# Patient Record
Sex: Male | Born: 1955 | Race: Black or African American | Hispanic: No | Marital: Single | Smoking: Current every day smoker
Health system: Southern US, Community
[De-identification: ages and names within clinical notes are randomized; demographics above are authoritative.]

## PROBLEM LIST (undated history)

## (undated) DIAGNOSIS — Z9889 Other specified postprocedural states: Secondary | ICD-10-CM

## (undated) DIAGNOSIS — A150 Tuberculosis of lung: Secondary | ICD-10-CM

## (undated) DIAGNOSIS — Z8719 Personal history of other diseases of the digestive system: Secondary | ICD-10-CM

## (undated) HISTORY — PX: KIDNEY STONE SURGERY: SHX686

## (undated) HISTORY — PX: HERNIA REPAIR: SHX51

---

## 1998-03-27 ENCOUNTER — Emergency Department (HOSPITAL_COMMUNITY): Admission: EM | Admit: 1998-03-27 | Discharge: 1998-03-27 | Payer: Self-pay | Admitting: Emergency Medicine

## 2000-07-01 ENCOUNTER — Emergency Department (HOSPITAL_COMMUNITY): Admission: EM | Admit: 2000-07-01 | Discharge: 2000-07-01 | Payer: Self-pay | Admitting: Emergency Medicine

## 2000-07-06 ENCOUNTER — Encounter: Payer: Self-pay | Admitting: Emergency Medicine

## 2000-07-06 ENCOUNTER — Emergency Department (HOSPITAL_COMMUNITY): Admission: EM | Admit: 2000-07-06 | Discharge: 2000-07-06 | Payer: Self-pay | Admitting: Emergency Medicine

## 2001-01-06 ENCOUNTER — Encounter: Payer: Self-pay | Admitting: Emergency Medicine

## 2001-01-06 ENCOUNTER — Emergency Department (HOSPITAL_COMMUNITY): Admission: EM | Admit: 2001-01-06 | Discharge: 2001-01-06 | Payer: Self-pay | Admitting: Emergency Medicine

## 2001-11-04 ENCOUNTER — Encounter: Admission: RE | Admit: 2001-11-04 | Discharge: 2001-11-04 | Payer: Self-pay | Admitting: General Practice

## 2001-11-04 ENCOUNTER — Encounter: Payer: Self-pay | Admitting: General Practice

## 2001-12-27 ENCOUNTER — Encounter: Payer: Self-pay | Admitting: Urology

## 2001-12-27 ENCOUNTER — Ambulatory Visit (HOSPITAL_COMMUNITY): Admission: RE | Admit: 2001-12-27 | Discharge: 2001-12-27 | Payer: Self-pay | Admitting: Urology

## 2002-01-09 ENCOUNTER — Ambulatory Visit (HOSPITAL_BASED_OUTPATIENT_CLINIC_OR_DEPARTMENT_OTHER): Admission: RE | Admit: 2002-01-09 | Discharge: 2002-01-09 | Payer: Self-pay | Admitting: Urology

## 2002-01-15 ENCOUNTER — Emergency Department (HOSPITAL_COMMUNITY): Admission: EM | Admit: 2002-01-15 | Discharge: 2002-01-16 | Payer: Self-pay | Admitting: Emergency Medicine

## 2002-01-16 ENCOUNTER — Encounter: Payer: Self-pay | Admitting: Emergency Medicine

## 2003-08-08 ENCOUNTER — Emergency Department (HOSPITAL_COMMUNITY): Admission: EM | Admit: 2003-08-08 | Discharge: 2003-08-09 | Payer: Self-pay | Admitting: Emergency Medicine

## 2004-11-20 ENCOUNTER — Emergency Department (HOSPITAL_COMMUNITY): Admission: EM | Admit: 2004-11-20 | Discharge: 2004-11-20 | Payer: Self-pay | Admitting: Emergency Medicine

## 2006-08-29 ENCOUNTER — Emergency Department (HOSPITAL_COMMUNITY): Admission: EM | Admit: 2006-08-29 | Discharge: 2006-08-29 | Payer: Self-pay | Admitting: Emergency Medicine

## 2009-05-16 ENCOUNTER — Emergency Department (HOSPITAL_COMMUNITY): Admission: EM | Admit: 2009-05-16 | Discharge: 2009-05-16 | Payer: Self-pay | Admitting: Emergency Medicine

## 2011-04-10 NOTE — Op Note (Signed)
[facility]  Patient:    Victor Walker, Victor Walker Visit Number: [id number] MRN: [id number]          Service Type: NES Location: NESC Attending Physician:  Lindaann Slough Proc. Date: 01/09/02 Admit Date:  01/09/2002                             Operative Report  PREOPERATIVE DIAGNOSIS:  Right ureteral stone.  POSTOPERATIVE DIAGNOSIS:  Right ureteral stone.  PROCEDURE PERFORMED:  Cystoscopy and right retrograde pyelogram, ureteroscopy with stone extraction and insertion JJ catheter.  SURGEON:  Lindaann Slough, M.D.  ANESTHESIA:  General.  INDICATION:  The patient is a 55 year old male who has been complaining of right flank and right lower quadrant pain on and off for one year.  The CT scan showed a stone at the right distal ureter.  A repeat KUB showed the stone at the right UVJ.  The patient has continued to complain of pain.  He is schedule today for cystoscopy and stone manipulation.  DESCRIPTION OF PROCEDURE:  Under general anesthesia, the patient was prepped and draped, and placed in the dorsolithotomy position.  A #22 Wappler cystoscope was inserted into the bladder.  The urethra is normal.  There is no stone or tumor in the bladder.  The ureteral orifices are in normal position and shape with clear efflux.  A cone tip catheter was passed through the cystoscope into the ureteral orifice, and then contrast was then injected through the cone tip catheter.  A filling defect is visualized in the distal ureter.  The cone tip catheter was then removed.  A guidewire was then passed through the cystoscope, but could not be passed beyond the stone.  The cone tip catheter was then removed.  The glide wire was passed through the open end catheter into the ureter.  Then the open end catheter was advanced over the glide wire, and the glide wire was removed, and replaced with a guidewire. The cystoscope and open end catheter were then removed.  A #6 French  rigid ureteroscope was then passed in the bladder and into the ureter, but the ureteroscope could not be passed beyond the intramural ureter.  The ureteroscope was then removed.  A ureteral balloon catheter was then passed over the guidewire and the intramural ureter was dilated.  Then the ureteral balloon catheter was removed.  The uteroscope was then passed into the bladder and the ureter.  The stone was then visualized in the distal ureter and extracted with a stone basket.  The ureteroscope was then reinserted into the ureter and there was no evidence of ureteral injury.  The ureteroscope was then removed.  The guidewire was then backloaded into the cystoscope and a #6 [place], 26 JJ catheter was passed over the guidewire, the proximal end of the JJ catheter is in the collecting system.  The distal end is in the bladder.  The bladder was then emptied and the cystoscope removed.  The patient tolerated the procedure well and left the operating room in satisfactory condition to the post anesthesia care unit. Attending Physician:  Lindaann Slough DD:  01/09/02 TD:  01/09/02 Job: [id number]

## 2012-05-03 ENCOUNTER — Emergency Department (HOSPITAL_COMMUNITY)
Admission: EM | Admit: 2012-05-03 | Discharge: 2012-05-03 | Disposition: A | Discharge status: Home / Self Care | Payer: Self-pay | Attending: Emergency Medicine | Admitting: Emergency Medicine

## 2012-05-03 ENCOUNTER — Emergency Department (HOSPITAL_COMMUNITY): Payer: Self-pay

## 2012-05-03 ENCOUNTER — Encounter (HOSPITAL_COMMUNITY): Payer: Self-pay | Admitting: *Deleted

## 2012-05-03 DIAGNOSIS — R209 Unspecified disturbances of skin sensation: Secondary | ICD-10-CM | POA: Insufficient documentation

## 2012-05-03 DIAGNOSIS — M5430 Sciatica, unspecified side: Secondary | ICD-10-CM

## 2012-05-03 DIAGNOSIS — Z91013 Allergy to seafood: Secondary | ICD-10-CM | POA: Insufficient documentation

## 2012-05-03 DIAGNOSIS — Z87442 Personal history of urinary calculi: Secondary | ICD-10-CM | POA: Insufficient documentation

## 2012-05-03 DIAGNOSIS — M5137 Other intervertebral disc degeneration, lumbosacral region: Secondary | ICD-10-CM | POA: Insufficient documentation

## 2012-05-03 DIAGNOSIS — M549 Dorsalgia, unspecified: Secondary | ICD-10-CM

## 2012-05-03 DIAGNOSIS — Z880 Allergy status to penicillin: Secondary | ICD-10-CM | POA: Insufficient documentation

## 2012-05-03 DIAGNOSIS — F17200 Nicotine dependence, unspecified, uncomplicated: Secondary | ICD-10-CM | POA: Insufficient documentation

## 2012-05-03 DIAGNOSIS — M51379 Other intervertebral disc degeneration, lumbosacral region without mention of lumbar back pain or lower extremity pain: Secondary | ICD-10-CM | POA: Insufficient documentation

## 2012-05-03 MED ORDER — HYDROCODONE-ACETAMINOPHEN 5-500 MG PO TABS: 1.0000 | ORAL_TABLET | Freq: Four times a day (QID) | ORAL | Status: AC | PRN

## 2012-05-03 MED ORDER — HYDROCODONE-ACETAMINOPHEN 5-325 MG PO TABS
2.0000 | ORAL_TABLET | Freq: Once | ORAL | Status: AC
  Administered 2012-05-03: 2 via ORAL
  Filled 2012-05-03: qty 2

## 2012-05-03 MED ORDER — PREDNISONE 20 MG PO TABS: ORAL_TABLET | ORAL | Status: AC

## 2012-05-03 NOTE — ED Notes (Signed)
Pt getting dressed to be discharged home 

## 2012-05-03 NOTE — Discharge Instructions (Signed)
Your xrays were read as showing degenerative changes in the lumbar spine, L3-4, L4-5, and L5-S1.   Take prednisone as prescribed.  You may take vicodin as need for pain. No driving for the next 6 hours or when taking vicodin. Also, do not take tylenol or acetaminophen containing medication when taking vicodin. Avoid bending at waist or heavy lifting more than 20 lbs for the next week. Try heat/heating pad to sore area. Follow up with primary care doctor in coming week for recheck - discuss referral to back specialist and/or further workup if symptoms fail to improve/resolve.   Return to ER if worse, leg numbness/weakness, intractable pain, other concern.        RESOURCE GUIDE  Chronic Pain Problems: Contact [facility]  [phone] Patients need to be referred by their primary care doctor.  Insufficient Money for Medicine: Contact United Way:  call "211" or [facility] 780-470-6889.  No Primary Care Doctor: - Call [facility]  [phone] - can help you locate a primary care doctor that  accepts your insurance, provides certain services, etc. - Physician Referral Service- [phone]  Agencies that provide inexpensive medical care: - [name]  [phone] - [name]  [phone] - [facility]  [phone] - Women's Clinic  [phone] - Planned Parenthood  [phone] [facility]  [phone]  Medicaid-accepting [facility] Providers: - [facility]- [address]  [phone], Mon-Fri 9am-7pm, Sat 9am-1pm - [facility]- [address] [place], [address] [place]  [phone] - [facility]- [address] [place]  [phone] [facility]- [address]  [phone] - Renaye Rakers- [address] [place], [address], [phone]  Only accepts [place] patients after they have their name  applied to their  card  Self Pay (no insurance) in [place]: - Sickle Cell Patients: Dr Willey Blade, [facility]  [address] [place], [phone] - [facility]- [address] [place]  [phone]       [name] [place]- [address]       -     [facility]- see information above (Speak to [employer] if you do not have insurance)       -  [facility]- [address] [place], [phone]       -  [facility]- [address],  [phone]       -  [facility]- [address], [phone]       -  Dr Julio Sicks-  [address], [place], [phone]       -  [facility]- [address] [phone]       -  [facility]- [address] [phone], also [address], [phone]       -    [facility]- [address] [place], [phone], 1st & 3rd Saturday   every month, 10am-1pm  1) Find a Doctor and Pay Out of Pocket Although you won't have to find out who is covered by your insurance plan, it is a good idea to ask around and get recommendations. You will then need to call the office and see if the doctor you have chosen will accept you as  a new patient and what types of options they offer for patients who are self-pay. Some doctors offer discounts or will set up payment plans for their patients who do not have insurance, but you will need to ask so you aren't surprised when you get to your appointment.  2) Contact Your Local Health Department Not all health departments have doctors that can see patients for sick visits, but many do, so it is worth a call to see if yours does. If you don't know where your local health department is, you can check in your phone book. The CDC also has a tool to help you locate your state's health department, and many state websites also have listings of all of their local health departments.  3) Find a Walk-in Clinic If your illness is not  likely to be very severe or complicated, you may want to try a walk in clinic. These are popping up all over the country in pharmacies, drugstores, and shopping centers. They're usually staffed by nurse practitioners or physician assistants that have been trained to treat common illnesses and complaints. They're usually fairly quick and inexpensive. However, if you have serious medical issues or chronic medical problems, these are probably not your best option  STD Testing - [facility] Department of [facility] [place], STD Clinic, [address], [place], phone [phone] or [phone].  Monday - Friday, call for an appointment. [facility] Department of [employer], STD Clinic, [address], [place], phone [phone] or [phone].  Monday - Friday, call for an appointment.  Abuse/Neglect: [facility] Child Abuse Hotline 231-470-3763 [facility] Child Abuse Hotline 272-454-8671 (After Hours)  Emergency Shelter:  [facility] 562-620-9645  Maternity Homes: - [facility] [facility] 334-087-5607 - [facility] (201)296-2049  MRSA Hotline #:   321-842-7809  [facility]  [facility]  [employer] [facility] Dept. [address]                 [address]         [address] [place] [address]  Blondell Reveal Phone:  [phone]                                  Phone:  [phone]                   Phone:  [phone]  [facility], [phone] - [facility] - [facility]- [phone]       -     [facility] in [place], [address]                                  [phone], [facility] Child Abuse Hotline 906-046-5279 or (272)608-0626 (After  Hours)   Behavioral  Health Services  Substance Abuse Resources: - [facility] Drug Services  (939)831-0859 - [facility] ([phone] - [facility] [place] [phone] Floydene Flock [phone] - Residential & Outpatient Substance Abuse Program  989-831-5734  Psychological Services: [facility]  [phone] [facility]  [phone] - [facility], [phone] [place]. [address] [place], ACCESS LINE: (440)[phone] or 2767387629, [employer]za  Dental Assistance  If unable to pay or uninsured, contact:  [facility] or [facility]. to become qualified for the adult dental clinic.  Patients with Medicaid: [facility] [phone] [address], [phone] [address], [phone]  If unable to pay, or uninsured, contact [facility] 8076788234) or [facility] Department [phone] in [place], [phone] in [facility]) to become qualified for the adult dental clinic  Other Low-Cost Community Dental Services: - [facility]- [address] [place], [place], [place], [address], [phone], [address], 2nd and 4th Thursday of the month at 6:30am.  10 clients each day by appointment, can sometimes see walk-in patients if someone does not show for an appointment. [facility]- [address] [place], [place], [address], [phone] - [facility]- [address] [place], [place], [address], [phone] [facility] Health Department- [phone] [facility] Health Department- [phone] [facility] [phone]    Back Pain, Adult Low back pain is very common. About 1 in 5 people have back pain.The cause of low back pain is rarely dangerous. The pain often gets better over time.About half of people with a sudden onset of back pain feel better in just 2 weeks. About 8 in 10 people feel better by 6 weeks.  CAUSES Some common causes of  back pain include:  Strain of the muscles or ligaments supporting the spine.   Wear and tear (degeneration) of the spinal discs.   Arthritis.   Direct injury to the back.  DIAGNOSIS Most of the time, the direct cause of low back pain is not known.However, back pain can be treated effectively even when the exact cause of the pain is unknown.Answering your caregiver's questions about your overall health and symptoms is one of the most accurate ways to make sure the cause of your pain is not dangerous. If your caregiver needs more information, he or she may order lab work or imaging tests (X-rays or MRIs).However, even if imaging tests show changes in your back, this usually does not require surgery. HOME CARE INSTRUCTIONS For many people, back pain returns.Since low back pain is rarely dangerous, it is often a condition that people can learn to [facility] their own.   Remain active. It is stressful on the back to sit or stand in one place. Do not sit, drive, or stand in one place for more than 30 minutes at a time. Take short walks on level surfaces as soon as pain allows.Try to increase the length of time you walk each day.   Do not stay in bed.Resting more than 1 or 2 days can delay your recovery.   Do not avoid exercise or work.Your body is made to move.It is not dangerous to be active, even though your back may hurt.Your back will likely heal faster if you return to being active before your pain is gone.   Pay attention to your body when you bend and lift. Many people have less discomfortwhen lifting if they bend their knees, keep the load  close to their bodies,and avoid twisting. Often, the most comfortable positions are those that put less stress on your recovering back.   Find a comfortable position to sleep. Use a firm mattress and lie on your side with your knees slightly bent. If you lie on your back, put a pillow under your knees.   Only take over-the-counter or  prescription medicines as directed by your caregiver. Over-the-counter medicines to reduce pain and inflammation are often the most helpful.Your caregiver may prescribe muscle relaxant drugs.These medicines help dull your pain so you can more quickly return to your normal activities and healthy exercise.   Put ice on the injured area.   Put ice in a plastic bag.   Place a towel between your skin and the bag.   Leave the ice on for 15 to 20 minutes, 3 to 4 times a day for the first 2 to 3 days. After that, ice and heat may be alternated to reduce pain and spasms.   Ask your caregiver about trying back exercises and gentle massage. This may be of some benefit.   Avoid feeling anxious or stressed.Stress increases muscle tension and can worsen back pain.It is important to recognize when you are anxious or stressed and learn ways to manage it.Exercise is a great option.  SEEK MEDICAL CARE IF:  You have pain that is not relieved with rest or medicine.   You have pain that does not improve in 1 week.   You have new symptoms.   You are generally not feeling well.  SEEK IMMEDIATE MEDICAL CARE IF:   You have pain that radiates from your back into your legs.   You develop new bowel or bladder control problems.   You have unusual weakness or numbness in your arms or legs.   You develop nausea or vomiting.   You develop abdominal pain.   You feel faint.  Document Released: 11/09/2005 Document Revised: 10/29/2011 Document Reviewed: 03/30/2011 [facility] Patient Information 2012 [place], [place].      Sciatica Sciatica is a weakness and/or changes in sensation (tingling, jolts, hot and cold, numbness) along the path the sciatic nerve travels. Irritation or damage to lumbar nerve roots is often also referred to as lumbar radiculopathy.  Lumbar radiculopathy (Sciatica) is the most common form of this problem. Radiculopathy can occur in any of the nerves coming out of the spinal cord. The  problems caused depend on which nerves are involved. The sciatic nerve is the large nerve supplying the branches of nerves going from the hip to the toes. It often causes a numbness or weakness in the skin and/or muscles that the sciatic nerve serves. It also may cause symptoms (problems) of pain, burning, tingling, or electric shock-like feelings in the path of this nerve. This usually comes from injury to the fibers that make up the sciatic nerve. Some of these symptoms are low back pain and/or unpleasant feelings in the following areas:  From the mid-buttock down the back of the leg to the back of the knee.   And/or the outside of the calf and top of the foot.   And/or behind the inner ankle to the sole of the foot.  CAUSES   Herniated or slipped disc. Discs are the little cushions between the bones in the back.   Pressure by the piriformis muscle in the buttock on the sciatic nerve (Piriformis Syndrome).   Misalignment of the bones in the lower back and buttocks (Sacroiliac Joint Derangement).   Narrowing of  the spinal canal that puts pressure on or pinches the fibers that make up the sciatic nerve.   A slipped vertebra that is out of line with those above or beneath it.   Abnormality of the nervous system itself so that nerve fibers do not transmit signals properly, especially to feet and calves (neuropathy).   Tumor (this is rare).  Your caregiver can usually determine the cause of your sciatica and begin the treatment most likely to help you. TREATMENT  Taking over-the-counter painkillers, physical therapy, rest, exercise, spinal manipulation, and injections of anesthetics and/or steroids may be used. Surgery, acupuncture, and Yoga can also be effective. Mind over matter techniques, mental imagery, and changing factors such as your bed, chair, desk height, posture, and activities are other treatments that may be helpful. You and your caregiver can help determine what is best for you.  With proper diagnosis, the cause of most sciatica can be identified and removed. Communication and cooperation between your caregiver and you is essential. If you are not successful immediately, do not be discouraged. With time, a proper treatment can be found that will make you comfortable. HOME CARE INSTRUCTIONS   If the pain is coming from a problem in the back, applying ice to that area for 15 to 20 minutes, 3 to 4 times per day while awake, may be helpful. Put the ice in a plastic bag. Place a towel between the bag of ice and your skin.   You may exercise or perform your usual activities if these do not aggravate your pain, or as suggested by your caregiver.   Only take over-the-counter or prescription medicines for pain, discomfort, or fever as directed by your caregiver.   If your caregiver has given you a follow-up appointment, it is very important to keep that appointment. Not keeping the appointment could result in a chronic or permanent injury, pain, and disability. If there is any problem keeping the appointment, you must call back to this facility for assistance.  SEEK IMMEDIATE MEDICAL CARE IF:   You experience loss of control of bowel or bladder.   You have increasing weakness in the trunk, buttocks, or legs.   There is numbness in any areas from the hip down to the toes.   You have difficulty walking or keeping your balance.   You have any of the above, with fever or forceful vomiting.  Document Released: 11/03/2001 Document Revised: 10/29/2011 Document Reviewed: 06/22/2008 [facility] Patient Information 2012 [place], [place].

## 2012-05-03 NOTE — ED Notes (Signed)
Patient reports he has lower back pain that is chronic but worse for 1 week with numbness in his legs.  Patient states he has pain in his right upper back that increases with movement/sneezing etc for 6 mth

## 2012-05-03 NOTE — ED Provider Notes (Addendum)
History    This chart was scribed for Suzi Roots, MD, MD by Smitty Pluck. The patient was seen in room STRE7 and the patient's care was started at 3:35PM.   CSN: [id number]  Arrival date & time 05/03/12  1436   None     Chief Complaint  Patient presents with  . Back Pain  . Numbness    (Consider location/radiation/quality/duration/timing/severity/associated sxs/prior treatment) The history is provided by the patient.   Victor Walker is a 56 y.o. male who presents to the Emergency Department complaining of moderate lower back pain onset for years but has worsened within the last week and upper back pain. Pt reports he has trouble walking and standing due to pain. He notes occasionally legs feel tingly, although denies any current leg numbness or weakness. he reports he works in [employer]. Upper back pain is aggravated by movement. Pt reports the he has had disc problems in past. Pain has been constant since onset. Denies numbness and weakness in legs currently. He reports the pain radiates to legs at night while he is sleeping. He reports having diaphoresis at night. No problems controlling bowel or bladder function. No perineal numbness. No fever or chills.   History reviewed. No pertinent past medical history.  Past Surgical History  Procedure Date  . Kidney stone surgery     No family history on file.  History  Substance Use Topics  . Smoking status: Current Everyday Smoker  . Smokeless tobacco: Not on file  . Alcohol Use: Yes      Review of Systems  Constitutional: Negative for fever and chills.  Cardiovascular: Negative for chest pain and leg swelling.  Gastrointestinal: Negative for nausea, vomiting and diarrhea.  Musculoskeletal: Positive for back pain. Negative for joint swelling.  Neurological: Negative for weakness and headaches.    Allergies  Aspirin; Penicillins; and Shrimp  Home Medications   Current Outpatient Rx  Name Route Sig Dispense  Refill  . PRESCRIPTION MEDICATION Oral Take 1 tablet by mouth once.      BP 118/102  Pulse 94  Temp(Src) 98.4 F (36.9 C) (Oral)  Resp 18  SpO2 100%  Physical Exam  Nursing note and vitals reviewed. Constitutional: He is oriented to person, place, and time. He appears well-developed and well-nourished. No distress.  HENT:  Head: Atraumatic.  Eyes: Pupils are equal, round, and reactive to light.  Neck: Neck supple. No tracheal deviation present.  Cardiovascular: Normal rate, regular rhythm, normal heart sounds and intact distal pulses.  Exam reveals no gallop and no friction rub.   No murmur heard. Pulmonary/Chest: Effort normal and breath sounds normal. No accessory muscle usage. No respiratory distress. He exhibits tenderness.  Abdominal: Soft. Bowel sounds are normal. He exhibits no distension. There is no tenderness.  Genitourinary:       No cva tenderness.   Musculoskeletal: Normal range of motion.       ctls spine non tender, aligned, no step off. Diffuse lumbar tenderness.   Neurological: He is alert and oriented to person, place, and time. He displays normal reflexes.       Motor intact bil. Steady gait.   Skin: Skin is warm and dry.  Psychiatric: He has a normal mood and affect.    ED Course  Procedures (including critical care time) DIAGNOSTIC STUDIES: Oxygen Saturation is 100% on room air, normal by my interpretation.    COORDINATION OF CARE: 3:37PM EDP discusses pt ED treatment with pt.  Dg Chest 2 View  05/03/2012  *RADIOLOGY REPORT*  Clinical Data: Pain in the right scapula and in the low back, no acute injury  CHEST - 2 VIEW  Comparison: None  Findings: No active infiltrate or effusion is seen.  Mediastinal contours appear normal.  The heart is within normal limits in size. No bony abnormality is seen.  IMPRESSION: No active lung disease.  Original Report Authenticated By: Juline Patch, M.D.   Dg Lumbar Spine Complete  05/03/2012  *RADIOLOGY REPORT*  Clinical  Data: Pain in the mid to low back, no injury  LUMBAR SPINE - COMPLETE 4+ VIEW  Comparison: None.  Findings:  There is a mild anterolisthesis of L4 on L5 by approximately 5 mm.  There is degenerative disc disease particularly at L5-S1 and to a lesser degree at L3-4 with loss of disc space and sclerosis with spurring.  There is degenerative change involving the facet joints of L4-5 and L5-S1.  The SI joints appear normal.  IMPRESSION:  1.  Degenerative disc disease at L3-4 and L5-S1. 2.  Mild anterolisthesis of L4 on L5 most likely degenerative. 3.  Degenerative change involves the facet joints of L4-5 and L5- S1.  Original Report Authenticated By: Juline Patch, M.D.    ]    MDM  I personally performed the services described in this documentation, which was scribed in my presence. The recorded information has been reviewed and considered. Suzi Roots, MD   vicodin po.   Recheck pt comfortable. Discussed xray results.    Discussed diff dx including ddd/herniated disc w pt and need for close pcp follow up.     Suzi Roots, MD 05/03/12 1647  Suzi Roots, MD 05/03/12 [phone]

## 2012-08-09 ENCOUNTER — Encounter (HOSPITAL_COMMUNITY): Payer: Self-pay | Admitting: Emergency Medicine

## 2012-08-09 ENCOUNTER — Emergency Department (HOSPITAL_COMMUNITY)
Admission: EM | Admit: 2012-08-09 | Discharge: 2012-08-09 | Disposition: A | Discharge status: Home / Self Care | Payer: Self-pay | Attending: Emergency Medicine | Admitting: Emergency Medicine

## 2012-08-09 DIAGNOSIS — Z880 Allergy status to penicillin: Secondary | ICD-10-CM | POA: Insufficient documentation

## 2012-08-09 DIAGNOSIS — Z886 Allergy status to analgesic agent status: Secondary | ICD-10-CM | POA: Insufficient documentation

## 2012-08-09 DIAGNOSIS — R21 Rash and other nonspecific skin eruption: Secondary | ICD-10-CM | POA: Insufficient documentation

## 2012-08-09 DIAGNOSIS — Z91013 Allergy to seafood: Secondary | ICD-10-CM | POA: Insufficient documentation

## 2012-08-09 MED ORDER — PREDNISONE 20 MG PO TABS
60.0000 mg | ORAL_TABLET | Freq: Once | ORAL | Status: AC
  Administered 2012-08-09: 60 mg via ORAL
  Filled 2012-08-09: qty 3

## 2012-08-09 MED ORDER — PREDNISONE 50 MG PO TABS: ORAL_TABLET | ORAL | Status: DC

## 2012-08-09 NOTE — ED Notes (Signed)
Pt c/o rash to ankles and legs that is itchy x 3 days

## 2012-08-09 NOTE — ED Provider Notes (Signed)
History     CSN: [id number]  Arrival date & time 08/09/12  1058   First MD Initiated Contact with Patient 08/09/12 1239      Chief Complaint  Patient presents with  . Rash    Patient is a 56 y.o. male presenting with rash. The history is provided by the patient.  Rash  This is a recurrent problem. The current episode started more than 1 week ago. The problem has been gradually worsening. The problem is associated with nothing. There has been no fever. Affected Location: bilateral lower extremities. The patient is experiencing no pain. The pain has been constant since onset. Associated symptoms include blisters and itching.    PMH - none  Past Surgical History  Procedure Date  . Kidney stone surgery     History reviewed. No pertinent family history.  History  Substance Use Topics  . Smoking status: Current Every Day Smoker  . Smokeless tobacco: Not on file  . Alcohol Use: Yes      Review of Systems  Constitutional: Negative for fever.  Skin: Positive for itching and rash.    Allergies  Aspirin; Penicillins; and Shrimp  Home Medications  No current outpatient prescriptions on file.  BP 128/73  Pulse 73  Temp 98.4 F (36.9 C) (Oral)  Resp 18  SpO2 98%  Physical Exam CONSTITUTIONAL: Well developed/well nourished HEAD AND FACE: Normocephalic/atraumatic EYES: EOMI ENMT: Mucous membranes moist NECK: supple no meningeal signsd LUNGS:  no apparent distress ABDOMEN: soft, nontender, no rebound or guarding NEURO: Pt is awake/alert, moves all extremitiesx4 EXTREMITIES:  full ROM SKIN: warm, color normal, scattered papules to bilateral feet and tibial surfaces, no petechiae, no abscess noted PSYCH: no abnormalities of mood noted  ED Course  Procedures   Doubt tick borne illness.  Reports it has been present for months but recent worsening with itching.  Will try short course of prednisone.  Given chronicity of rash, will send RPR.  Pt agreeable to f/u on  results    MDM  Nursing notes including past medical history and social history reviewed and considered in documentation         Joya Gaskins, MD 08/09/12 1244

## 2014-05-07 ENCOUNTER — Emergency Department (INDEPENDENT_AMBULATORY_CARE_PROVIDER_SITE_OTHER)
Admission: EM | Admit: 2014-05-07 | Discharge: 2014-05-07 | Disposition: A | Discharge status: Home / Self Care | Payer: Self-pay | Source: Home / Self Care | Attending: Family Medicine | Admitting: Family Medicine

## 2014-05-07 ENCOUNTER — Encounter (HOSPITAL_COMMUNITY): Payer: Self-pay | Admitting: Emergency Medicine

## 2014-05-07 DIAGNOSIS — W269XXA Contact with unspecified sharp object(s), initial encounter: Secondary | ICD-10-CM

## 2014-05-07 DIAGNOSIS — T1500XA Foreign body in cornea, unspecified eye, initial encounter: Secondary | ICD-10-CM

## 2014-05-07 MED ORDER — TOBRAMYCIN 0.3 % OP SOLN
1.0000 [drp] | Freq: Four times a day (QID) | OPHTHALMIC | Status: DC
  Administered 2014-05-07: 1 [drp] via OPHTHALMIC

## 2014-05-07 MED ORDER — TOBRAMYCIN 0.3 % OP SOLN
OPHTHALMIC | Status: AC
  Filled 2014-05-07: qty 5

## 2014-05-07 MED ORDER — TETRACAINE HCL 0.5 % OP SOLN
OPHTHALMIC | Status: AC
  Filled 2014-05-07: qty 2

## 2014-05-07 NOTE — ED Provider Notes (Signed)
CSN: [id number]     Arrival date & time 05/07/14  1823 History   First MD Initiated Contact with Patient 05/07/14 1933     Chief Complaint  Patient presents with  . Foreign Body in Eye   (Consider location/radiation/quality/duration/timing/severity/associated sxs/prior Treatment) HPI Comments: 58 year old male presents for evaluation of getting a metallic foreign body embedded in his right eye at work on Saturday, 2 days ago. 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 He has tried flushing his eyes repeatedly but cannot get the object out of his eye. It was initially quite painful but it is not as bad now. His vision is not affected. He does not have an ophthalmologist.  Patient is a 58 y.o. male presenting with foreign body in eye.  Foreign Body in Eye    History reviewed. No pertinent past medical history. Past Surgical History  Procedure Laterality Date  . Kidney stone surgery     No family history on file. History  Substance Use Topics  . Smoking status: Current Every Day Smoker  . Smokeless tobacco: Not on file  . Alcohol Use: Yes    Review of Systems  Eyes:       See history of present illness  All other systems reviewed and are negative.   Allergies  Aspirin; Penicillins; and Shrimp  Home Medications   Prior to Admission medications   Medication Sig Start Date End Date Taking? Authorizing Provider  predniSONE (DELTASONE) 50 MG tablet One tablet po daily for 4 days 08/09/12   Joya Gaskinsonald W Wickline, MD   BP 110/75  Pulse 71  Temp(Src) 98.3 F (36.8 C) (Oral)  Resp 12  SpO2 100% Physical Exam  Nursing note and vitals reviewed. Constitutional: He is oriented to person, place, and time. He appears well-developed and well-nourished. No distress.  HENT:  Head: Normocephalic.  Eyes: EOM are normal. Right conjunctiva is injected.  Slit lamp exam:      The right eye shows foreign body and  fluorescein uptake.    There is no evidence of globe puncture  Pulmonary/Chest: Effort normal. No respiratory distress.  Neurological: He is alert and oriented to person, place, and time. Coordination normal.  Skin: Skin is warm and dry. No rash noted. He is not diaphoretic.  Psychiatric: He has a normal mood and affect. Judgment normal.    ED Course  Procedures (including critical care time) Labs Review Labs Reviewed - No data to display  Imaging Review No results found.  The eye was anesthetized with topical tetracaine drops. Fluorescein stain was applied with fluorescein uptake around the foreign body, with no extravasation of aqueous humor.  An attempt was made to remove the foreign body with a cotton swab, this was unsuccessful. The foreign body was then successfully removed with CHF an 18-gauge needle, the patient handled this without any adverse effect. Stain was applied to the eye again after removal, with again no extravasation of aqueous humor.    MDM   1. Corneal foreign body    Will provide tobramycin eyedrops for infection prophylaxis. He was given the tetracaine drops to use as needed for pain in the first 24 hours, research shows no adverse effects when tetracaine was used for analgesia following a corneal abrasion. He will followup with ophthalmology within 24 hours.  Meds ordered this encounter  Medications  . tobramycin (TOBREX) 0.3 % ophthalmic solution 1 drop  SigGraylon Good:        Geralyn Figiel H Rayshard Schirtzinger, PA-C 05/07/14 2031

## 2014-05-07 NOTE — ED Notes (Signed)
Patient c/o getting metal in eye at work on Saturday. 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 Patient is alert and oriented and in no acute distress.

## 2014-05-07 NOTE — Discharge Instructions (Signed)
Eye, Foreign Body The term foreign body refers to any object near, on the surface of or in the eye that should not be there. A foreign body may be a small speck of dirt or dust, a hair or eyelash, a splinter or any object. CAUSES  Foreign bodies can get in the eye by:  Flying pieces of something that was broken or destroyed (debris).  A sudden injury (trauma) to the eye. SYMPTOMS  Symptoms depend on what the foreign body is and where it is in the eye. The most common locations are:  On the inner surface of the upper or lower eyelids or on the covering of the white part of the eye (conjunctiva). Symptoms in this location are:  Irritating and painful, especially when blinking.  Feeling like something is in the eye.  On the surface of the clear covering on the front of the eye (cornea). A corneal foreign body has symptoms that:  Are painful and irritating since the cornea is very sensitive.  Form small "rust rings" around a metallic foreign body. Metallic foreign bodies stick more firmly to the surface of the cornea.  Inside the eyeball. Infection can happen fast and can be hard to treat with antibiotics. This is an extremely dangerous situation. Foreign bodies inside the eye can threaten vision. A person may even loose their eye. Foreign bodies inside the eye may cause:  Great pain.  Immediate loss of vision. DIAGNOSIS  Foreign bodies are found during an exam by an eye specialist. Those that are on the eyelids, conjunctiva or cornea are usually (but not always) easily found. When a foreign body is inside the eyeball, a cataract may form almost right away. This makes it hard for an ophthalmologist to find the foreign body. Special tests may be needed, including ultrasound testing, X-rays and CT scans. TREATMENT   Foreign bodies that are on the eyelids, conjunctiva or cornea are often removed easily and painlessly.  If the foreign body has caused a scratch or abrasion of the cornea,  antibiotic drops, ointments and/or a tight patch called a "pressure patch" may be needed. Follow-up exams will be needed for several days until the abrasion heals.  Surgery is needed right away if the foreign body is inside the eyeball. This is a medical emergency. An antibiotic therapy will likely be given to stop an infection. HOME CARE INSTRUCTIONS  The use of eye patches is not universal. Their use varies from state to state and from caregiver to caregiver. If an eye patch was applied:  Keep the eye patch on for as long as directed by your caregiver until the follow-up appointment.  Do not remove the patch to put in medications unless instructed to do so. When replacing the patch, retape it as it was before. Follow the same procedure if the patch becomes loose.  WARNING: Do not drive or operate machinery while the eye is patched. The ability to judge distances will be impaired.  Only take over-the-counter or prescription medicines for pain, discomfort or fever as directed by the caregiver. If no eye patch was applied:  Keep the eye closed as much as possible. Do not rub the eye.  Wear dark glasses as needed to protect the eyes from bright light.  Do not wear contact lenses until the eye feels normal again, or as instructed.  Wear protective eye covering if there is a risk of eye injury. This is important when working with high speed tools.  Only take over-the-counter or   prescription medicines for pain, discomfort or fever as directed by the caregiver. SEEK IMMEDIATE MEDICAL CARE IF:   Pain increases in the eye or the vision changes.  You or your child has problems with the eye patch.  The injury to the eye appears to be getting larger.  There is discharge from the injured eye.  Swelling and/or soreness (inflammation) develops around the affected eye.  You or your child has an oral temperature above 102 F (38.9 C), not controlled by medicine.  Your baby is older than 3  months with a rectal temperature of 102 F (38.9 C) or higher.  Your baby is 3 months old or younger with a rectal temperature of 100.4 F (38 C) or higher. MAKE SURE YOU:   Understand these instructions.  Will watch your condition.  Will get help right away if you are not doing well or get worse. Document Released: 11/09/2005 Document Revised: 02/01/2012 Document Reviewed: 04/06/2013 [employer] Patient Information 2014 [employer].  

## 2014-05-08 NOTE — ED Provider Notes (Signed)
Medical screening examination/treatment/procedure(s) were performed by resident physician or non-physician practitioner and as supervising physician I was immediately available for consultation/collaboration.   Barkley BrunsKINDL,Winslow Ederer DOUGLAS MD.   Linna HoffJames D Ciara Kagan, MD 05/08/14 [phone]

## 2016-11-05 ENCOUNTER — Encounter (HOSPITAL_COMMUNITY): Payer: Self-pay | Admitting: Emergency Medicine

## 2016-11-05 ENCOUNTER — Ambulatory Visit (HOSPITAL_COMMUNITY)
Admission: EM | Admit: 2016-11-05 | Discharge: 2016-11-05 | Disposition: A | Discharge status: Other Acute Inpatient Hospital | Payer: Self-pay

## 2016-11-05 ENCOUNTER — Emergency Department (HOSPITAL_COMMUNITY): Payer: Self-pay

## 2016-11-05 ENCOUNTER — Emergency Department (HOSPITAL_COMMUNITY)
Admission: EM | Admit: 2016-11-05 | Discharge: 2016-11-05 | Disposition: A | Discharge status: Home / Self Care | Payer: Self-pay | Attending: Emergency Medicine | Admitting: Emergency Medicine

## 2016-11-05 DIAGNOSIS — F17200 Nicotine dependence, unspecified, uncomplicated: Secondary | ICD-10-CM | POA: Insufficient documentation

## 2016-11-05 DIAGNOSIS — K4021 Bilateral inguinal hernia, without obstruction or gangrene, recurrent: Secondary | ICD-10-CM | POA: Insufficient documentation

## 2016-11-05 DIAGNOSIS — K4090 Unilateral inguinal hernia, without obstruction or gangrene, not specified as recurrent: Secondary | ICD-10-CM

## 2016-11-05 LAB — CBC WITH DIFFERENTIAL/PLATELET
BASOS PCT: 1 %
Basophils Absolute: 0 10*3/uL (ref 0.0–0.1)
EOS ABS: 0.1 10*3/uL (ref 0.0–0.7)
Eosinophils Relative: 1 %
HCT: 41.7 % (ref 39.0–52.0)
HEMOGLOBIN: 13.9 g/dL (ref 13.0–17.0)
Lymphocytes Relative: 36 %
Lymphs Abs: 2.4 10*3/uL (ref 0.7–4.0)
MCH: 28.6 pg (ref 26.0–34.0)
MCHC: 33.3 g/dL (ref 30.0–36.0)
MCV: 85.8 fL (ref 78.0–100.0)
MONOS PCT: 7 %
Monocytes Absolute: 0.5 10*3/uL (ref 0.1–1.0)
NEUTROS PCT: 55 %
Neutro Abs: 3.8 10*3/uL (ref 1.7–7.7)
Platelets: 285 10*3/uL (ref 150–400)
RBC: 4.86 MIL/uL (ref 4.22–5.81)
RDW: 14 % (ref 11.5–15.5)
WBC: 6.8 10*3/uL (ref 4.0–10.5)

## 2016-11-05 LAB — BASIC METABOLIC PANEL
Anion gap: 8 (ref 5–15)
BUN: 10 mg/dL (ref 6–20)
CALCIUM: 9.1 mg/dL (ref 8.9–10.3)
CO2: 26 mmol/L (ref 22–32)
CREATININE: 0.85 mg/dL (ref 0.61–1.24)
Chloride: 106 mmol/L (ref 101–111)
GFR calc non Af Amer: >60 mL/min (ref >60)
Glucose, Bld: 89 mg/dL (ref 65–99)
Potassium: 3.9 mmol/L (ref 3.5–5.1)
SODIUM: 140 mmol/L (ref 135–145)

## 2016-11-05 MED ORDER — IOPAMIDOL (ISOVUE-300) INJECTION 61%
INTRAVENOUS | Status: AC
  Administered 2016-11-05: 100 mL
  Filled 2016-11-05: qty 100

## 2016-11-05 NOTE — ED Provider Notes (Signed)
[facility]-EMERGENCY DEPT Provider Note   CSN: [id number] Arrival date & time: 11/05/16  1101     History   Chief Complaint Chief Complaint  Patient presents with  . Groin Pain    HPI Victor Walker is a 60 y.o. male with a PMH of tuberculosis in his 10520s presenting to the ED with right groin pain for the last year. He states that he noticed a "knot" in his groin 1 year ago that was about the size of a marble. Over the last year, the knot has grown to the size of a ping pong ball. He has severe "throbbing" pain whenever he coughs or lifts something. He also notes throbbing at night that keeps him awake. The pain comes and goes, but often becomes worse as the day goes on. The pain does not radiate. He has noted associated erectile dysfunction over the last 6 months. He has not been seen by a doctor for the groin pain because he doesn't have insurance. No severe constant pain, nausea, vomiting. No fevers, no chills. Having regular daily bowel movements.  HPI  History reviewed. No pertinent past medical history.  There are no active problems to display for this patient.   Past Surgical History:  Procedure Laterality Date  . KIDNEY STONE SURGERY       Home Medications    Prior to Admission medications   Medication Sig Start Date End Date Taking? Authorizing Provider  predniSONE (DELTASONE) 50 MG tablet One tablet po daily for 4 days 08/09/12   Zadie Rhineonald Wickline, MD    Family History History reviewed. No pertinent family history.  Social History Social History  Substance Use Topics  . Smoking status: Current Every Day Smoker  . Smokeless tobacco: Never Used  . Alcohol use Yes     Allergies   Aspirin; Penicillins; and Shrimp [shellfish allergy]   Review of Systems Review of Systems 10 Systems reviewed and are negative for acute change except as noted in the HPI.  Physical Exam Updated Vital Signs BP 127/83   Pulse 65   Temp 98.6 F (37 C) (Oral)   Resp 18   SpO2  96%   Physical Exam  Constitutional: He appears well-developed and well-nourished.  HENT:  Head: Normocephalic and atraumatic.  Mouth/Throat: Oropharynx is clear and moist.  Eyes: Conjunctivae and EOM are normal. Pupils are equal, round, and reactive to light.  Neck: Normal range of motion. Neck supple.  Cardiovascular: Normal rate and regular rhythm.   No murmur heard. Pulmonary/Chest: Effort normal and breath sounds normal. No respiratory distress.  Abdominal: Soft. He exhibits no distension. There is no tenderness. There is no rebound and no guarding.  Genitourinary:  Genitourinary Comments: 5cm x 4cm bulging mass present at the opening of the right inguinal canal that is tender to palpation, mass is reducible  Musculoskeletal: Normal range of motion. He exhibits no edema.  Neurological: He is alert.  Skin: Skin is warm and dry.  Psychiatric: He has a normal mood and affect. His behavior is normal. Thought content normal.  Nursing note and vitals reviewed.  ED Treatments / Results  Labs (all labs ordered are listed, but only abnormal results are displayed) Labs Reviewed  BASIC METABOLIC PANEL  CBC WITH DIFFERENTIAL/PLATELET  I-STAT CHEM 8, ED    EKG  EKG Interpretation None       Radiology Dg Abdomen 1 View  Result Date: 11/05/2016 CLINICAL DATA:  Right lower quadrant pain for several weeks EXAM: ABDOMEN - 1 VIEW  COMPARISON:  Lumbar spine series of May 03, 2012 in report of an abdominal series of January 16, 2002. FINDINGS: There are scattered small air-fluid levels throughout the abdomen. No significant small or large bowel distention is observed. There is no gas in the rectum. No abnormal soft tissue calcifications are observed. There is a phlebolith in the right aspect of the pelvis. 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 The lung bases are grossly clear. IMPRESSION: Bowel gas pattern suggests a mild ileus or partial distal  obstruction. No evidence of perforation. Electronically Signed   By: David  SwazilandJordan M.D.   On: 11/05/2016 13:16    Procedures Procedures (including critical care time)  Medications Ordered in ED Medications - No data to display   Initial Impression / Assessment and Plan / ED Course  I have reviewed the triage vital signs and the nursing notes.  Pertinent labs & imaging results that were available during my care of the patient were reviewed by me and considered in my medical decision making (see chart for details).  Clinical Course    Victor Walker is a 60 year old male presenting to the ED with a right groin mass consistent with an indirect inguinal hernia. No severe constant pain, nausea, or vomiting that would be concerning for an incarcerated bowel. The hernia is reducible. Pt currently does not have any insurance, but is working on getting some and thinks he will have insurance in January. Will get a KUB to rule out obstruction, but think patient can be discharged home with referral to general surgery.  1:45PM: KUB with gas pattern suggestive of mild ileus or partial distal obstruction. Will get CBC, BMP, and CT abdomen with contrast to evaluate further.  4:20PM: Cr normal. Will proceed with CT abdomen with contrast.  4:30PM: Will transfer care to oncoming physician, Dr. Clarene DukeLittle.  Final Clinical Impressions(s) / ED Diagnoses   Final diagnoses:  Indirect inguinal hernia    New Prescriptions New Prescriptions   No medications on file     Campbell StallKaty Dodd Keyanna Sandefer, MD 11/05/16 1629    Shaune Pollackameron Isaacs, MD 11/06/16 [phone]

## 2016-11-05 NOTE — ED Provider Notes (Addendum)
I received this patient in signout from Dr. Nancy MarusMayo and Dr. Erma HeritageIsaacs. We were awaiting CT of the abdomen and pelvis to ensure no complications from known inguinal hernia. CT does show bilaterally when of hernias with small amount of bowel in the right inguinal hernia. On reexamination the patient is well-appearing, requesting food, with no severe pain. Repeat examination shows easily reducible area on the right inguinal canal with no tenderness on examination. Given no evidence of incarceration or strangulation, I feel he is safe for discharge. I have extensively reviewed return precautions and emphasized importance of follow-up with [facility] for definitive management. Patient voiced understanding and was discharged in satisfactory condition.   Laurence Spatesachel Morgan Little, MD 11/05/16 1843   8:23 PM 11/06/16 I called patient and notified him of incidental finding of pulmonary nodule. In[facility] to have repeat CT scan in 1 year for monitoring. He voiced understanding.    Laurence Spatesachel Morgan Little, MD 11/06/16 2025

## 2016-11-05 NOTE — ED Triage Notes (Signed)
Pt sts right sided groin pain with possible hernia; pt sts some pain; pt sts hernia x 1 year

## 2016-11-05 NOTE — ED Notes (Signed)
NAD at this time. Pt is stable and going home.  

## 2016-11-05 NOTE — Discharge Instructions (Signed)
[facility] schedule an [facility] as soon as possible to discuss management of your hernia. Return immediately if you have any severe, constant pain after hernia site, vomiting, or inability to pass gas or stool.

## 2018-04-04 ENCOUNTER — Other Ambulatory Visit: Payer: Self-pay

## 2018-04-04 ENCOUNTER — Ambulatory Visit (HOSPITAL_COMMUNITY)
Admission: EM | Admit: 2018-04-04 | Discharge: 2018-04-04 | Disposition: A | Discharge status: Home / Self Care | Payer: BLUE CROSS/BLUE SHIELD | Attending: Family Medicine | Admitting: Family Medicine

## 2018-04-04 ENCOUNTER — Emergency Department (HOSPITAL_COMMUNITY): Payer: BLUE CROSS/BLUE SHIELD

## 2018-04-04 ENCOUNTER — Encounter (HOSPITAL_COMMUNITY): Payer: Self-pay | Admitting: Emergency Medicine

## 2018-04-04 ENCOUNTER — Emergency Department (HOSPITAL_COMMUNITY)
Admission: EM | Admit: 2018-04-04 | Discharge: 2018-04-04 | Disposition: A | Discharge status: Home / Self Care | Payer: BLUE CROSS/BLUE SHIELD | Attending: Emergency Medicine | Admitting: Emergency Medicine

## 2018-04-04 DIAGNOSIS — M549 Dorsalgia, unspecified: Secondary | ICD-10-CM

## 2018-04-04 DIAGNOSIS — M546 Pain in thoracic spine: Secondary | ICD-10-CM

## 2018-04-04 DIAGNOSIS — M62838 Other muscle spasm: Secondary | ICD-10-CM

## 2018-04-04 DIAGNOSIS — R079 Chest pain, unspecified: Secondary | ICD-10-CM

## 2018-04-04 DIAGNOSIS — F17200 Nicotine dependence, unspecified, uncomplicated: Secondary | ICD-10-CM | POA: Insufficient documentation

## 2018-04-04 HISTORY — DX: Other specified postprocedural states: Z98.890

## 2018-04-04 HISTORY — DX: Personal history of other diseases of the digestive system: Z87.19

## 2018-04-04 HISTORY — DX: Tuberculosis of lung: A15.0

## 2018-04-04 LAB — BASIC METABOLIC PANEL
ANION GAP: 5 (ref 5–15)
BUN: 7 mg/dL (ref 6–20)
CALCIUM: 9.2 mg/dL (ref 8.9–10.3)
CO2: 29 mmol/L (ref 22–32)
CREATININE: 0.94 mg/dL (ref 0.61–1.24)
Chloride: 106 mmol/L (ref 101–111)
GFR calc Af Amer: >60 mL/min (ref >60)
GFR calc non Af Amer: >60 mL/min (ref >60)
GLUCOSE: 95 mg/dL (ref 65–99)
Potassium: 4 mmol/L (ref 3.5–5.1)
Sodium: 140 mmol/L (ref 135–145)

## 2018-04-04 LAB — CBC
HCT: 42.2 % (ref 39.0–52.0)
HEMOGLOBIN: 14.2 g/dL (ref 13.0–17.0)
MCH: 29.6 pg (ref 26.0–34.0)
MCHC: 33.6 g/dL (ref 30.0–36.0)
MCV: 88.1 fL (ref 78.0–100.0)
Platelets: 287 10*3/uL (ref 150–400)
RBC: 4.79 MIL/uL (ref 4.22–5.81)
RDW: 14.1 % (ref 11.5–15.5)
WBC: 5.6 10*3/uL (ref 4.0–10.5)

## 2018-04-04 LAB — I-STAT TROPONIN, ED: TROPONIN I, POC: 0 ng/mL (ref 0.00–0.08)

## 2018-04-04 MED ORDER — METHOCARBAMOL 500 MG PO TABS: 500.0000 mg | ORAL_TABLET | Freq: Three times a day (TID) | ORAL | 0 refills | Status: DC | PRN

## 2018-04-04 NOTE — ED Triage Notes (Signed)
The patient presented to the [facility] with a complaint of muscle pain to the left neck and shoulder area x 4 days. The patient denied any known injury.

## 2018-04-04 NOTE — ED Provider Notes (Signed)
[facility]    CSN: [id number] Arrival date & time: 04/04/18  1011     History   Chief Complaint Chief Complaint  Patient presents with  . Muscle Pain    HPI Victor Walker is a 62 y.o. male.   Victor Walker presents with complaints of left neck and posterior shoulder pain which has been ongoing since 5/9. Movement worsens the pain but does have intermittent "dagger" like pain to posterior shoulder. States the pain will be stabbing intermittently even while at rest. States last week felt chest pain, he had to pull over even while driving due to pain, denies any since. No known injury but does work in [employer]. Has been taking tylenol which does not help. Denies numbness or tingling to arms or hands. States injured his shoulder approximately 5 years ago, no other history. Denies nausea, diaphoresis, jaw pain. States has an appointment with a new PCP June 7th to establish care. He does smoke.     ROS per HPI.      History reviewed. No pertinent past medical history.  There are no active problems to display for this patient.   Past Surgical History:  Procedure Laterality Date  . HERNIA REPAIR    . KIDNEY STONE SURGERY         Home Medications    Prior to Admission medications   Not on File    Family History History reviewed. No pertinent family history.  Social History Social History   Tobacco Use  . Smoking status: Current Every Day Smoker  . Smokeless tobacco: Never Used  Substance Use Topics  . Alcohol use: Yes  . Drug use: Yes    Types: Marijuana     Allergies   Aspirin; Penicillins; and Shrimp [shellfish allergy]   Review of Systems Review of Systems   Physical Exam Triage Vital Signs ED Triage Vitals [04/04/18 1046]  Enc Vitals Group     BP 112/69     Pulse Rate (!) 59     Resp 16     Temp 98.4 F (36.9 C)     Temp Source Oral     SpO2 98 %     Weight      Height      Head Circumference      Peak Flow      Pain  Score 8     Pain Loc      Pain Edu?      Excl. in GC?    No data found.  Updated Vital Signs BP 112/69 (BP Location: Left Arm)   Pulse (!) 59   Temp 98.4 F (36.9 C) (Oral)   Resp 16   SpO2 98%    Physical Exam  Constitutional: He is oriented to person, place, and time. He appears well-developed and well-nourished.  Cardiovascular: Normal rate and regular rhythm.  Pulmonary/Chest: Effort normal and breath sounds normal.  Musculoskeletal:       Cervical back: He exhibits decreased range of motion, tenderness, pain and spasm. He exhibits no bony tenderness, no swelling, no edema, no deformity, no laceration and normal pulse.       Back:  Left neck musculature with palpable spasm noted as well as left posterior trapezius at shoulder blade with point tenderness; without bony tenderness; mild pain with neck extension and with bilateral rotation; full left shoulder ROM without pain with arc test or rotation; sensation intact; strength equal bilaterally   After exam during rest and conversation a sudden onset  of pain causing patient to sink back into chair  Neurological: He is alert and oriented to person, place, and time.  Skin: Skin is warm and dry.   ekg with noted changes, sr rate 65  UC Treatments / Results  Labs (all labs ordered are listed, but only abnormal results are displayed) Labs Reviewed - No data to display  EKG None  Radiology No results found.  Procedures Procedures (including critical care time)  Medications Ordered in UC Medications - No data to display  Initial Impression / Assessment and Plan / UC Course  I have reviewed the triage vital signs and the nursing notes.  Pertinent labs & imaging results that were available during my care of the patient were reviewed by me and considered in my medical decision making (see chart for details).     Patient with palpable and reproducible neck and trapezius muscle pain. However, also with stabbing pain  while at rest and last week with quite significant chest pain. He does smoke, 62 yo african [place] male without any known history. Recommended further cardiac evaluation in ED at this time. Patient verbalized understanding and agreeable to plan.  Safe for self transfer to ed at this time.   Final Clinical Impressions(s) / UC Diagnoses   Final diagnoses:  Trapezius muscle spasm  Upper back pain     Discharge Instructions     Please go to the ER for further evaluation of your pain, due to the changes in your EKG and pain even while at rest I think having further evaluation is safest for you at this time.     ED Prescriptions    None     Controlled Substance Prescriptions [place] Controlled Substance Registry consulted? Not Applicable   Georgetta Haber, NP 04/04/18 1138

## 2018-04-04 NOTE — ED Notes (Signed)
Pt has been ambulatory in waiting room. Pt upset about having to wait. Pt stated he needs to get seen so he can get to work.

## 2018-04-04 NOTE — ED Provider Notes (Signed)
[facility] EMERGENCY DEPARTMENT Provider Note   CSN: [id number] Arrival date & time: 04/04/18  1141     History   Chief Complaint Chief Complaint  Patient presents with  . Chest Pain  . Back Pain    HPI Victor Walker is a 62 y.o. male.  HPI Patient presents with chest pain.  Has had over the last week.  Sometimes has a sharp left-sided anterior chest pain.  Comes and goes.  Not associate with exertion.  Lasts a few seconds.  However for the last few days he is also had left posterior shoulder pain.  Radiates down to the back.  Worse with movements.  Able spasm at times.  Seen at urgent care and sent here because of risk factors and the chest pain.  He is a smoker.  No known cardiac disease.  Pain is not, no exertion.  He is in the process of finding a new primary care doctor and states he is going to see Dr. Parke Simmers. Past Medical History:  Diagnosis Date  . H/O bilateral inguinal hernia repair   . TB (pulmonary tuberculosis)     There are no active problems to display for this patient.   Past Surgical History:  Procedure Laterality Date  . HERNIA REPAIR    . KIDNEY STONE SURGERY          Home Medications    Prior to Admission medications   Medication Sig Start Date End Date Taking? Authorizing Provider  methocarbamol (ROBAXIN) 500 MG tablet Take 1 tablet (500 mg total) by mouth every 8 (eight) hours as needed for muscle spasms. 04/04/18   Benjiman Core, MD    Family History No family history on file.  Social History Social History   Tobacco Use  . Smoking status: Current Every Day Smoker  . Smokeless tobacco: Never Used  Substance Use Topics  . Alcohol use: Yes  . Drug use: Yes    Types: Marijuana     Allergies   Aspirin; Penicillins; and Shrimp [shellfish allergy]   Review of Systems Review of Systems  Constitutional: Negative for appetite change.  HENT: Negative for congestion.   Respiratory: Positive for cough.  Negative for shortness of breath.   Cardiovascular: Positive for chest pain.  Gastrointestinal: Negative for abdominal distention and abdominal pain.  Genitourinary: Negative for flank pain.  Musculoskeletal: Positive for back pain.  Skin: Negative for rash.  Neurological: Negative for syncope.  Hematological: Negative for adenopathy.  Psychiatric/Behavioral: Negative for behavioral problems and confusion.     Physical Exam Updated Vital Signs BP (!) 130/99 (BP Location: Left Arm)   Pulse 60   Temp 98.2 F (36.8 C) (Oral)   Resp 16   SpO2 100%   Physical Exam  Constitutional: He appears well-developed.  HENT:  Head: Atraumatic.  Neck: Neck supple.  Cardiovascular: Regular rhythm and normal pulses.  No murmur heard. Pulmonary/Chest: Effort normal. He has no wheezes. He has no rales.  Musculoskeletal:       Right lower leg: He exhibits no tenderness and no edema.  Tenderness over left back musculature medial to the scapula.  Also some over superior neck muscles.  Pain worse with movement.  No anterior chest tenderness.  Skin: Skin is warm. Capillary refill takes less than 2 seconds.     ED Treatments / Results  Labs (all labs ordered are listed, but only abnormal results are displayed) Labs Reviewed  BASIC METABOLIC PANEL  CBC  I-STAT TROPONIN, ED  EKG None ED ECG REPORT   Date: 04/04/2018  Rate: 70  Rhythm: normal sinus rhythm  QRS Axis: normal  Intervals: normal  ST/T Wave abnormalities: nonspecific T wave changes  Conduction Disutrbances:none    Radiology Dg Chest 2 View  Result Date: 04/04/2018 CLINICAL DATA:  Chest pain for 1 week with left shoulder pain, initial encounter EXAM: CHEST - 2 VIEW COMPARISON:  05/03/2012 FINDINGS: Cardiac shadow is within normal limits. Aortic calcifications are again seen. The lungs are well aerated bilaterally. No focal infiltrate or effusion is seen. No acute bony abnormality is noted. IMPRESSION: No active  cardiopulmonary disease. Electronically Signed   By: Victor Clever M.D.   On: 04/04/2018 13:05    Procedures Procedures (including critical care time)  Medications Ordered in ED Medications - No data to display   Initial Impression / Assessment and Plan / ED Course  I have reviewed the triage vital signs and the nursing notes.  Pertinent labs & imaging results that were available during my care of the patient were reviewed by me and considered in my medical decision making (see chart for details).     With musculoskeletal neck and back pain.  Also dull chest pain comes and goes.  EKG reassuring and enzymes negative.  Has had pain for the last week.  Discharge home and follow-up with new PCP peer patient  Final Clinical Impressions(s) / ED Diagnoses   Final diagnoses:  Nonspecific chest pain  Acute left-sided thoracic back pain    ED Discharge Orders        Ordered    methocarbamol (ROBAXIN) 500 MG tablet  Every 8 hours PRN     04/04/18 1646       Benjiman Core, MD 04/04/18 1656

## 2018-04-04 NOTE — Discharge Instructions (Addendum)
Please go to the ER for further evaluation of your pain, due to the changes in your EKG and pain even while at rest I think having further evaluation is safest for you at this time.

## 2018-04-04 NOTE — ED Notes (Signed)
Pt states that he was having CP last week, but is having back pain today, not complaining of CP

## 2018-04-04 NOTE — ED Triage Notes (Signed)
Pt states chest pain last week that moved to the left shoulder blade on Friday. Pain currently only in the shoulder blade 8/10. No shortness of breath. Sent by UC for abnormal EKG./

## 2018-08-28 ENCOUNTER — Encounter: Payer: Self-pay | Admitting: Orthopedic Surgery

## 2018-09-09 ENCOUNTER — Encounter (INDEPENDENT_AMBULATORY_CARE_PROVIDER_SITE_OTHER): Payer: Self-pay

## 2018-09-09 ENCOUNTER — Ambulatory Visit (INDEPENDENT_AMBULATORY_CARE_PROVIDER_SITE_OTHER): Payer: BLUE CROSS/BLUE SHIELD | Admitting: Orthopedic Surgery

## 2018-09-09 ENCOUNTER — Encounter (INDEPENDENT_AMBULATORY_CARE_PROVIDER_SITE_OTHER): Payer: Self-pay | Admitting: Orthopedic Surgery

## 2018-09-21 ENCOUNTER — Encounter (INDEPENDENT_AMBULATORY_CARE_PROVIDER_SITE_OTHER): Payer: Self-pay

## 2018-09-21 ENCOUNTER — Ambulatory Visit (INDEPENDENT_AMBULATORY_CARE_PROVIDER_SITE_OTHER): Payer: BLUE CROSS/BLUE SHIELD | Admitting: Orthopedic Surgery

## 2018-09-29 ENCOUNTER — Ambulatory Visit (INDEPENDENT_AMBULATORY_CARE_PROVIDER_SITE_OTHER): Payer: BLUE CROSS/BLUE SHIELD | Admitting: Orthopedic Surgery

## 2018-09-29 ENCOUNTER — Encounter (INDEPENDENT_AMBULATORY_CARE_PROVIDER_SITE_OTHER): Payer: Self-pay | Admitting: Orthopedic Surgery

## 2018-09-29 DIAGNOSIS — S838X2A Sprain of other specified parts of left knee, initial encounter: Secondary | ICD-10-CM

## 2018-09-29 MED ORDER — LIDOCAINE HCL 1 % IJ SOLN
5.0000 mL | INTRAMUSCULAR | Status: AC | PRN
  Administered 2018-09-29: 5 mL

## 2018-09-29 MED ORDER — BUPIVACAINE HCL 0.25 % IJ SOLN
4.0000 mL | INTRAMUSCULAR | Status: AC | PRN
  Administered 2018-09-29: 4 mL via INTRA_ARTICULAR

## 2018-09-29 MED ORDER — METHYLPREDNISOLONE ACETATE 40 MG/ML IJ SUSP
40.0000 mg | INTRAMUSCULAR | Status: AC | PRN
  Administered 2018-09-29: 40 mg via INTRA_ARTICULAR

## 2018-09-29 NOTE — Addendum Note (Signed)
Addended byPrescott Parma on: 09/29/2018 10:53 AM   Modules accepted: Orders

## 2018-09-29 NOTE — Progress Notes (Signed)
Office Visit Note   Victor Walker: Victor Walker           Date of Birth: Aug 27, 1956           MRN: [id number] Visit Date: 09/29/2018 Requested by: Frederich Chick., MD [address] [place], [place] [address] PCP: Victor Walker, No Pcp Per  Subjective: Chief Complaint  Victor Walker presents with  . 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  He states that the knee will "lock up" and he has to manipulate it into a pain-free position.  This is been happening on several occasions.  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  Denies any prior history of injury to the knee.  Had radiographs done at [facility] which by report from him were negative.  He does describe distinct locking episodes of the knee with medial joint line tenderness.  He has been taking some over-the-counter medication for the problem.  He does want to continue to work in [employer] representative but feels a certain degree of concern about his safety.              ROS: All systems reviewed are negative as they relate to the chief complaint within the history of present illness.  Victor Walker denies  fevers or chills.   Assessment & Plan: Visit Diagnoses:  1. Injury of meniscus of left knee, initial encounter     Plan: Impression is left knee pain with likely unstable bucket-handle type tear of the meniscus.  His ACL feels stable.  Lateral meniscal pathology also possible.  Plan is aspiration and injection today for pain relief on that left knee and then MRI scan of that left knee to evaluate for meniscal pathology.  I will see him back after that study  Follow-Up Instructions: Return for after MRI.   Orders:  No orders of the defined types were placed in this encounter.  No orders of the defined types were placed in this encounter.     Procedures: Large Joint Inj: L knee on 09/29/2018 10:43 AM Indications: diagnostic evaluation, joint swelling and  pain Details: 18 G 1.5 in needle, superolateral approach  Arthrogram: No  Medications: 5 mL lidocaine 1 %; 40 mg methylPREDNISolone acetate 40 MG/ML; 4 mL bupivacaine 0.25 % Aspirate: 30 mL yellow Outcome: tolerated well, no immediate complications Procedure, treatment alternatives, risks and benefits explained, specific risks discussed. Consent was given by the Victor Walker. Immediately prior to procedure a time out was called to verify the correct Victor Walker, procedure, equipment, support staff and site/side marked as required. Victor Walker was prepped and draped in the usual sterile fashion.       Clinical Data: No additional findings.  Objective: Vital Signs: There were no vitals taken for this visit.  Physical Exam:   Constitutional: Victor Walker appears well-developed HEENT:  Head: Normocephalic Eyes:EOM are normal Neck: Normal range of motion Cardiovascular: Normal rate Pulmonary/chest: Effort normal Neurologic: Victor Walker is alert Skin: Skin is warm Psychiatric: Victor Walker has normal mood and affect    Ortho Exam: Ortho exam demonstrates mild effusion left knee but with good range of motion.  Collateral cruciate ligaments feel stable.  There is medial joint line tenderness and intact extensor mechanism.  Lateral joint line tenderness is less prominent.  Pedal pulses are palpable.  Specialty Comments:  No specialty comments available.  Imaging: No results found.   PMFS History: There are no active problems to display for this  Victor Walker.  Past Medical History:  Diagnosis Date  . H/O bilateral inguinal hernia repair   . TB (pulmonary tuberculosis)     History reviewed. No pertinent family history.  Past Surgical History:  Procedure Laterality Date  . HERNIA REPAIR    . KIDNEY STONE SURGERY     Social History   Occupational History  . Not on file  Tobacco Use  . Smoking status: Current Every Day Smoker  . Smokeless tobacco: Never Used  Substance and Sexual Activity  .  Alcohol use: Yes  . Drug use: Yes    Types: Marijuana  . Sexual activity: Not on file

## 2018-10-04 ENCOUNTER — Other Ambulatory Visit (INDEPENDENT_AMBULATORY_CARE_PROVIDER_SITE_OTHER): Payer: Self-pay | Admitting: Orthopedic Surgery

## 2018-10-04 DIAGNOSIS — T1590XA Foreign body on external eye, part unspecified, unspecified eye, initial encounter: Secondary | ICD-10-CM

## 2018-10-05 ENCOUNTER — Ambulatory Visit (INDEPENDENT_AMBULATORY_CARE_PROVIDER_SITE_OTHER): Payer: BLUE CROSS/BLUE SHIELD | Admitting: Orthopedic Surgery

## 2018-10-13 ENCOUNTER — Ambulatory Visit
Admission: RE | Admit: 2018-10-13 | Discharge: 2018-10-13 | Disposition: A | Discharge status: Home / Self Care | Payer: BLUE CROSS/BLUE SHIELD | Source: Ambulatory Visit | Attending: Orthopedic Surgery | Admitting: Orthopedic Surgery

## 2018-10-13 DIAGNOSIS — S838X2A Sprain of other specified parts of left knee, initial encounter: Secondary | ICD-10-CM

## 2018-10-13 DIAGNOSIS — T1590XA Foreign body on external eye, part unspecified, unspecified eye, initial encounter: Secondary | ICD-10-CM

## 2018-10-19 ENCOUNTER — Ambulatory Visit (INDEPENDENT_AMBULATORY_CARE_PROVIDER_SITE_OTHER): Payer: BLUE CROSS/BLUE SHIELD | Admitting: Orthopedic Surgery

## 2018-10-31 ENCOUNTER — Telehealth (INDEPENDENT_AMBULATORY_CARE_PROVIDER_SITE_OTHER): Payer: Self-pay | Admitting: Orthopedic Surgery

## 2018-10-31 NOTE — Telephone Encounter (Signed)
He can see another doctor if he wants, but as of now he is totally booked unfortunately

## 2018-10-31 NOTE — Telephone Encounter (Signed)
Patient came into the office to r/s his appointment that he missed on 10/19/18.  He would like an appointment before the end of the year, but was advised that Dr. August Saucerean did not have anything before January 2nd.  Patient is changing insurances and wants to be seen before January.  CB#[phone].  Thank you.

## 2020-01-13 ENCOUNTER — Ambulatory Visit: Payer: BLUE CROSS/BLUE SHIELD | Attending: Internal Medicine

## 2020-01-25 ENCOUNTER — Ambulatory Visit: Payer: BLUE CROSS/BLUE SHIELD | Attending: Internal Medicine

## 2020-01-25 DIAGNOSIS — Z23 Encounter for immunization: Secondary | ICD-10-CM | POA: Insufficient documentation

## 2020-01-25 NOTE — Progress Notes (Signed)
   Covid-19 Vaccination Clinic  Name:  Victor Walker    MRN: [id number] DOB: Jun 07, 1956  01/25/2020  [name] was observed post Covid-19 immunization for 30 minutes based on pre-vaccination screening without incident. He was provided with Vaccine Information Sheet and instruction to access the V-Safe system.   [name] was instructed to call 911 with any severe reactions post vaccine: Marland Kitchen Difficulty breathing  . Swelling of face and throat  . A fast heartbeat  . A bad rash all over body  . Dizziness and weakness   Immunizations Administered    Name Date Dose VIS Date Route   Pfizer COVID-19 Vaccine 01/25/2020  4:10 PM 0.3 mL 11/03/2019 Intramuscular   Manufacturer: [employer], [employer]   Lot: [id number]   NDC: [address]-1

## 2020-02-21 ENCOUNTER — Ambulatory Visit: Payer: BLUE CROSS/BLUE SHIELD | Attending: Internal Medicine

## 2020-02-21 DIAGNOSIS — Z23 Encounter for immunization: Secondary | ICD-10-CM

## 2020-02-21 NOTE — Progress Notes (Signed)
   Covid-19 Vaccination Clinic  Name:  BRENTYN SEEHAFER    MRN: [id number] DOB: [birth year]  02/21/2020  Mr. Ehrmann was observed post Covid-19 immunization for 30 minutes based on pre-vaccination screening without incident. He was provided with Vaccine Information Sheet and instruction to access the V-Safe system.   Mr. Tony was instructed to call 911 with any severe reactions post vaccine: Marland Kitchen Difficulty breathing  . Swelling of face and throat  . A fast heartbeat  . A bad rash all over body  . Dizziness and weakness   Immunizations Administered    Name Date Dose VIS Date Route   Pfizer COVID-19 Vaccine 02/21/2020  8:41 AM 0.3 mL 11/03/2019 Intramuscular   Manufacturer: [employer], [employer]   Lot: [id number]   NDC: [address]-3

## 2021-01-01 NOTE — Progress Notes (Unsigned)
Patient referred by Bartholome Bill, MD for chest pain  Subjective:   Victor Walker, male    DOB: 16-Oct-1956, 65 y.o.   MRN: [id number]   Chief Complaint  Patient presents with  . chest pain on exertion  . Coronary Artery Disease     HPI  65 y.o. 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  In the last few weeks, he has had at least few episodes of chest pain, both with rest and exertion.  2 times, he had left-sided squeezing chest pain while driving.  He had to pull over to the side of the road and rest.  Pain subsided and 10 minutes.  On occasions, he has had episodes while working at [employer] sites.  He is able to continue working through these episodes of pain, but sometimes has to rest and relax for the pain to get over.  He feels that "the pain could occur anytime".  This has affected his ability to work.  Patient smokes 1 pack/day, and is not ready to quit at this time.  On a separate note, he tells me that he is going to undergo CT scan of his abdomen to evaluate his inguinal hernia, for which he has previously had surgery.  He was prescribed atorvastatin by Dr. Luciana Axe, but he has stopped taking it.  Past Medical History:  Diagnosis Date  . H/O bilateral inguinal hernia repair   . TB (pulmonary tuberculosis)      Past Surgical History:  Procedure Laterality Date  . HERNIA REPAIR    . KIDNEY STONE SURGERY       Social History   Tobacco Use  Smoking Status Current Every Day Smoker  Smokeless Tobacco Never Used    Social History   Substance and Sexual Activity  Alcohol Use Yes     Family History  Problem Relation Age of Onset  . Diabetes Mother   . Hypertension Mother      Current Outpatient Medications on File Prior to Visit  Medication Sig Dispense Refill  . atorvastatin (LIPITOR)  20 MG tablet Take 1 tablet by mouth daily.    . traMADol (ULTRAM) 50 MG tablet Take 1-2 tablets by mouth as needed.     No current facility-administered medications on file prior to visit.    Cardiovascular and other pertinent studies:  EKG 01/02/2021: Sinus rhythm 68 bpm  Old anterior infarct  EKG 12/06/2020:  Sinus rhythm  Incomplete right bundle branch block  Septal infarct , age undetermined   CT Chest 08/26/2020:  1. Stable appearance of bilateral pulmonary nodules with irregular nodularity centered in the LEFT upper lobe. This may reflect the sequela of age-indeterminate tuberculosis.  2. Given stable nodularity, then recommend future CT in 12 months (18-24 months from from January 2021). This recommendation follows the consensus                                                     statement: Guidelines for Management of Incidental Pulmonary Nodules Detected on CT Images: From the Fleischner Society 2017; Radiology 2017;  [id number].  Vascular/Lymphatic: The patient has extensive aortoiliac atherosclerosis without aneurysm. No lymphadenopathy.      Recent labs: 12/06/2020: Glucose 90, BUN/Cr 16/1.06. EGFR 77. Na/K 142/4.0. Rest of the CMP normal H/H 13.6/41.3. MCV 86.3. Platelets 306 Chol 210, TG 63, HDL 57, LDL 140 TSH 1.4 normal  07/2020: HbA1C 5.8%   Review of Systems  Cardiovascular: Positive for chest pain. Negative for dyspnea on exertion, leg swelling, palpitations and syncope.        Vitals:   01/02/21 1230  BP: 108/73  Pulse: 82  SpO2: 98%     Body mass index is 24.82 kg/m. Filed Weights   01/02/21 1230  Weight: 173 lb (78.5 kg)     Objective:   Physical Exam Vitals and nursing note reviewed.  Constitutional:      General: He is not in acute distress. Neck:     Vascular: No JVD.  Cardiovascular:     Rate and Rhythm: Normal rate and regular rhythm.     Pulses: Normal pulses.     Heart sounds: Normal  heart sounds. No murmur heard.   Pulmonary:     Effort: Pulmonary effort is normal.     Breath sounds: Normal breath sounds. No wheezing or rales.  Musculoskeletal:     Right lower leg: No edema.     Left lower leg: No edema.         Assessment & Recommendations:   65 y.o. African American male with hyperlipidemia, tobacco dependence, h/o pulmonary tuberculosis, with exertional chest pain  Chest pain: Features of both typical and atypical chest pain, which has occurred both at rest and exertion.  Patient has multiple risk factors for CAD, including hyperlipidemia, tobacco dependence.  Recommend Plavix 75 mg daily (known to have anaphylaxis reaction to aspirin, as well as penicillin and shrimp), and, Lipitor 20 mg daily.  Started metoprolol 25 mg twice daily.  Also prescribed sublingual nitroglycerin for as needed use.  Obtain coronary CT angiogram for definitive coronary evaluation.  No known IV contrast dye allergy.  At this time, patient would like to avoid invasive work-up and management owing to health issues in his family-with his mother and wife.  Mixed hyperlipidemia: Patient is willing to take Lipitor 20 mg daily.  Thank you for referring the patient to [place]. Please feel free to contact with any questions.   Nigel Mormon, MD Pager: [phone] Office: [phone]

## 2021-01-02 ENCOUNTER — Encounter: Payer: Self-pay | Admitting: Cardiology

## 2021-01-02 ENCOUNTER — Ambulatory Visit: Payer: Medicare Other | Admitting: Cardiology

## 2021-01-02 ENCOUNTER — Other Ambulatory Visit: Payer: Self-pay

## 2021-01-02 VITALS — BP 108/73 | HR 82 | Ht 70.0 in | Wt 173.0 lb

## 2021-01-02 DIAGNOSIS — E782 Mixed hyperlipidemia: Secondary | ICD-10-CM | POA: Insufficient documentation

## 2021-01-02 DIAGNOSIS — R079 Chest pain, unspecified: Secondary | ICD-10-CM

## 2021-01-02 DIAGNOSIS — I208 Other forms of angina pectoris: Secondary | ICD-10-CM

## 2021-01-02 MED ORDER — NITROGLYCERIN 0.4 MG SL SUBL: 0.4000 mg | SUBLINGUAL_TABLET | SUBLINGUAL | 3 refills | Status: AC | PRN

## 2021-01-02 MED ORDER — CLOPIDOGREL BISULFATE 75 MG PO TABS: 75.0000 mg | ORAL_TABLET | Freq: Every day | ORAL | 3 refills | Status: AC

## 2021-01-02 MED ORDER — METOPROLOL TARTRATE 25 MG PO TABS: 25.0000 mg | ORAL_TABLET | Freq: Two times a day (BID) | ORAL | 2 refills | Status: AC

## 2021-01-02 NOTE — Patient Instructions (Signed)
Your cardiac CT will be scheduled at the locations below:   [facility]  [address]  [place], [place] [address]  [phone]    If scheduled at [facility], please arrive at the [facility] main entrance of [facility] 30-45 minutes prior to test start time.  Proceed to the [facility] Radiology Department (first floor) to check-in and test prep.   Please follow these instructions carefully (unless otherwise directed):    Hold all erectile dysfunction medications at least 3 days (72 hrs) prior to test.   On the Night Before the Test:   Be sure to Drink plenty of water.   Do not consume any caffeinated/decaffeinated beverages or chocolate 12 hours prior to your test.   Do not take any antihistamines 12 hours prior to your test.   If the patient has contrast allergy:  Patient will need a prescription for Prednisone and very clear instructions (as follows):  1. Prednisone 50 mg - take 13 hours prior to test  2. Take another Prednisone 50 mg 7 hours prior to test  3. Take another Prednisone 50 mg 1 hour prior to test  4. Take Benadryl 50 mg 1 hour prior to test   Patient must complete all four doses of above prophylactic medications.   Patient will need a ride after test due to Benadryl.   On the Day of the Test:   Drink plenty of water. Do not drink any water within one hour of the test.   Do not eat any food 4 hours prior to the test.   You may take your regular medications prior to the test.    - Metoprolol tartarate Take 2 pills of 25 mg 2 hours before CT scan         After the Test:   Drink plenty of water.   After receiving IV contrast, you may experience a mild flushed feeling. This is normal.   On occasion, you may experience a mild rash up to 24 hours after the test. This is not dangerous. If this occurs, you can take Benadryl 25 mg and increase your fluid intake.   If you experience trouble breathing, this can be  serious. If it is severe call 911 IMMEDIATELY. If it is mild, please call our office.   If you take any of these medications: Glipizide/Metformin, Avandament, Glucavance, please do not take 48 hours after completing test unless otherwise instructed.     Please contact the cardiac imaging nurse navigator should you have any questions/concerns  Rockwell Alexandria, RN Navigator Cardiac Imaging  [facility]  [phone] Office  [phone] Cell

## 2021-01-15 ENCOUNTER — Other Ambulatory Visit: Payer: Medicare Other

## 2021-01-16 ENCOUNTER — Other Ambulatory Visit: Payer: Medicare Other

## 2021-01-20 ENCOUNTER — Telehealth (HOSPITAL_COMMUNITY): Payer: Self-pay | Admitting: Emergency Medicine

## 2021-01-20 ENCOUNTER — Telehealth (HOSPITAL_COMMUNITY): Payer: Self-pay | Admitting: *Deleted

## 2021-01-20 ENCOUNTER — Other Ambulatory Visit: Payer: Self-pay | Admitting: Cardiology

## 2021-01-20 DIAGNOSIS — I208 Other forms of angina pectoris: Secondary | ICD-10-CM

## 2021-01-20 NOTE — Telephone Encounter (Signed)
Attempted to call patient regarding upcoming cardiac CT appointment. °Left message on voicemail with name and callback number ° °Kermitt Harjo RN Navigator Cardiac Imaging °[facility] °[phone] Office °[phone] Cell ° °

## 2021-01-20 NOTE — Telephone Encounter (Signed)
Pt returning phone call regarding upcoming cardiac imaging study; pt verbalizes understanding of appt date/time, parking situation and where to check in, pre-test NPO status and medications ordered, and verified current allergies; name and call back number provided for further questions should they arise Victor Alexandria RN Navigator Cardiac Imaging [facility] [phone] office [phone] cell   Asked pt to get labs, pt requesting istat draw day of scan Victor Walker

## 2021-01-21 ENCOUNTER — Other Ambulatory Visit: Payer: Medicare Other

## 2021-01-22 ENCOUNTER — Encounter: Payer: Medicare Other | Admitting: *Deleted

## 2021-01-22 ENCOUNTER — Encounter (HOSPITAL_COMMUNITY): Payer: Self-pay

## 2021-01-22 ENCOUNTER — Other Ambulatory Visit: Payer: Self-pay

## 2021-01-22 ENCOUNTER — Ambulatory Visit (HOSPITAL_COMMUNITY)
Admission: RE | Admit: 2021-01-22 | Discharge: 2021-01-22 | Disposition: A | Discharge status: Home / Self Care | Payer: Medicare Other | Source: Ambulatory Visit | Attending: Family Medicine | Admitting: Family Medicine

## 2021-01-22 DIAGNOSIS — R079 Chest pain, unspecified: Secondary | ICD-10-CM | POA: Diagnosis not present

## 2021-01-22 DIAGNOSIS — Z006 Encounter for examination for normal comparison and control in clinical research program: Secondary | ICD-10-CM

## 2021-01-22 DIAGNOSIS — I208 Other forms of angina pectoris: Secondary | ICD-10-CM | POA: Diagnosis present

## 2021-01-22 LAB — POCT I-STAT CREATININE: Creatinine, Ser: 1 mg/dL (ref 0.61–1.24)

## 2021-01-22 MED ORDER — NITROGLYCERIN 0.4 MG SL SUBL
SUBLINGUAL_TABLET | SUBLINGUAL | Status: AC
  Filled 2021-01-22: qty 2

## 2021-01-22 MED ORDER — METOPROLOL TARTRATE 5 MG/5ML IV SOLN: 5.0000 mg | INTRAVENOUS | Status: DC | PRN

## 2021-01-22 MED ORDER — IOHEXOL 350 MG/ML SOLN
80.0000 mL | Freq: Once | INTRAVENOUS | Status: AC | PRN
  Administered 2021-01-22: 80 mL via INTRAVENOUS

## 2021-01-22 MED ORDER — NITROGLYCERIN 0.4 MG SL SUBL
0.8000 mg | SUBLINGUAL_TABLET | Freq: Once | SUBLINGUAL | Status: AC
  Administered 2021-01-22: 0.8 mg via SUBLINGUAL

## 2021-01-22 NOTE — Research (Signed)
IDENTIFY Informed Consent   Subject Name: Victor Walker  Subject met inclusion and exclusion criteria.  The informed consent form, study requirements and expectations were reviewed with the subject and questions and concerns were addressed prior to the signing of the consent form.  The subject verbalized understanding of the trial requirements.  The subject agreed to participate in the IDENTIFY trial and signed the informed consent at 0655 on 01/22/2021.  The informed consent was obtained prior to performance of any protocol-specific procedures for the subject.  A copy of the signed informed consent was given to the subject and a copy was placed in the subject's medical record.   LUTTERLOH, KIMBERLY D    

## 2021-01-22 NOTE — Progress Notes (Signed)
CT scan completed. Tolerated well. D/C home in wheelchair, awake and alert. In no distress 

## 2021-01-29 HISTORY — PX: CATARACT EXTRACTION: SUR2

## 2021-02-05 ENCOUNTER — Ambulatory Visit: Payer: Medicare Other | Admitting: Cardiology

## 2021-02-05 HISTORY — PX: CATARACT EXTRACTION: SUR2

## 2021-02-06 ENCOUNTER — Other Ambulatory Visit: Payer: Self-pay

## 2021-02-06 ENCOUNTER — Ambulatory Visit: Payer: Medicare Other

## 2021-02-06 DIAGNOSIS — I208 Other forms of angina pectoris: Secondary | ICD-10-CM

## 2021-02-10 NOTE — Progress Notes (Signed)
Called and spoke with patient regarding his echocardiogram.

## 2021-02-11 ENCOUNTER — Encounter (INDEPENDENT_AMBULATORY_CARE_PROVIDER_SITE_OTHER): Payer: Self-pay

## 2021-02-12 ENCOUNTER — Other Ambulatory Visit: Payer: Self-pay

## 2021-02-12 ENCOUNTER — Ambulatory Visit (INDEPENDENT_AMBULATORY_CARE_PROVIDER_SITE_OTHER): Payer: Medicare Other | Admitting: Ophthalmology

## 2021-02-12 ENCOUNTER — Encounter (INDEPENDENT_AMBULATORY_CARE_PROVIDER_SITE_OTHER): Payer: Self-pay | Admitting: Ophthalmology

## 2021-02-12 DIAGNOSIS — H353221 Exudative age-related macular degeneration, left eye, with active choroidal neovascularization: Secondary | ICD-10-CM | POA: Diagnosis not present

## 2021-02-12 DIAGNOSIS — H353211 Exudative age-related macular degeneration, right eye, with active choroidal neovascularization: Secondary | ICD-10-CM | POA: Diagnosis not present

## 2021-02-12 DIAGNOSIS — Z961 Presence of intraocular lens: Secondary | ICD-10-CM | POA: Diagnosis not present

## 2021-02-12 NOTE — Progress Notes (Signed)
02/12/2021     CHIEF COMPLAINT Patient presents for Blurred Vision (Pt referred by Dr. [name] for CME OD after cat sx/Pt states the inferior outter quad of OD vision has a translucent dark spot. This spot also distorts objects. Pt using Ofloxacin and Pred Forte TID OU)   HISTORY OF PRESENT ILLNESS: Victor Walker is a 65 y.o. male who presents to the clinic today for:   HPI    Blurred Vision    In right eye.  Onset was sudden.  Vision is blurred and distorted.  Severity is moderate.  This started 2 weeks ago.  It is worse throughout the Victor Walker.  Since onset it is gradually worsening.  Treatments tried include eye drops.  Response to treatment was no improvement.  I, the attending physician,  performed the HPI with the patient and updated documentation appropriately. Additional comments: Pt referred by Dr. [name] for CME OD after cat sx Pt states the inferior outter quad of OD vision has a translucent dark spot. This spot also distorts objects. Pt using Ofloxacin and Pred Forte TID OU       Last edited by Elyse Jarvis on 02/12/2021  9:02 AM. (History)      Referring physician: [name], MD [address] [place],  [place] [address]  HISTORICAL INFORMATION:   Selected notes from the MEDICAL RECORD NUMBER       CURRENT MEDICATIONS: No current outpatient medications on file. (Ophthalmic Drugs)   No current facility-administered medications for this visit. (Ophthalmic Drugs)   Current Outpatient Medications (Other)  Medication Sig  . atorvastatin (LIPITOR) 20 MG tablet Take 1 tablet by mouth daily.  . clopidogrel (PLAVIX) 75 MG tablet Take 1 tablet (75 mg total) by mouth daily. (Patient not taking: Reported on 01/22/2021)  . metoprolol tartrate (LOPRESSOR) 25 MG tablet Take 1 tablet (25 mg total) by mouth 2 (two) times daily.  . nitroGLYCERIN (NITROSTAT) 0.4 MG SL tablet Place 1 tablet (0.4 mg total) under the tongue every 5 (five) minutes as  needed for chest pain.  . traMADol (ULTRAM) 50 MG tablet Take 1-2 tablets by mouth as needed.   No current facility-administered medications for this visit. (Other)      REVIEW OF SYSTEMS:    ALLERGIES Allergies  Allergen Reactions  . Aspirin Anaphylaxis  . Penicillins Anaphylaxis    Has patient had a PCN reaction causing immediate rash, facial/tongue/throat swelling, SOB or lightheadedness with hypotension:YES Has patient had a PCN reaction causing severe rash involving mucus membranes or skin necrosis: NO Has patient had a PCN reaction that required hospitalization NO Has patient had a PCN reaction occurring within the last 10 years:NO If all of the above answers are "NO", then may proceed with Cephalosporin use.  Marland Kitchen Shrimp [Shellfish Allergy] Anaphylaxis    PAST MEDICAL HISTORY Past Medical History:  Diagnosis Date  . H/O bilateral inguinal hernia repair   . TB (pulmonary tuberculosis)    Past Surgical History:  Procedure Laterality Date  . CATARACT EXTRACTION Right 01/29/2021   Dr. [name]  . CATARACT EXTRACTION Left 02/05/2021   Dr. [name]  . HERNIA REPAIR    . KIDNEY STONE SURGERY      FAMILY HISTORY Family History  Problem Relation Age of Onset  . Diabetes Mother   . Hypertension Mother     SOCIAL HISTORY Social History   Tobacco Use  . Smoking status: Current Every Cylinda Walker Smoker  . Smokeless tobacco: Never Used  Vaping Use  . Vaping Use: Never used  Substance Use Topics  . Alcohol use: Yes  . Drug use: Yes    Types: Marijuana         OPHTHALMIC EXAM: Base Eye Exam    Visual Acuity (Snellen - Linear)      Right Left   Dist [place] 20/200 +1 20/40 -1   Dist ph [place] 20/100 20/20 -1       Tonometry (Tonopen, 9:09 AM)      Right Left   Pressure 12 6       Pupils      Pupils Dark Light Shape React APD   Right PERRL 4 4 Round Minimal None   Left PERRL 3 3 Round Minimal None       Visual Fields (Counting fingers)      Left Right    Full Full        Neuro/Psych    Oriented x3: Yes   Mood/Affect: Normal       Dilation    Both eyes: 1.0% Mydriacyl, 2.5% Phenylephrine @ 9:09 AM        Slit Lamp and Fundus Exam    External Exam      Right Left   External Normal Normal       Slit Lamp Exam      Right Left   Lids/Lashes Normal Normal   Conjunctiva/Sclera White and quiet White and quiet   Cornea Clear Clear   Anterior Chamber Deep and quiet Deep and quiet   Iris Round and reactive Round and reactive   Lens Centered posterior chamber intraocular lens Centered posterior chamber intraocular lens   Anterior Vitreous Normal Normal       Fundus Exam      Right Left   Posterior Vitreous Posterior vitreous detachment Posterior vitreous detachment   Disc Normal Normal   C/D Ratio 0.2 0.2   Macula Macular thickening, Hard drusen, Retinal pigment epithelial mottling, Retinal pigment epithelial detachment, Subretinal neovascular membrane Pigmented nodule superonasal to the macula foveal region left eyeSmall   Vessels Normal    Periphery Normal           IMAGING AND PROCEDURES  Imaging and Procedures for 02/12/21  OCT, Retina - OU - Both Eyes       Right Eye Quality was good. Scan locations included subfoveal. Central Foveal Thickness: 479. Findings include abnormal foveal contour, choroidal neovascular membrane, intraretinal fluid, subretinal fluid.   Left Eye Quality was good. Scan locations included subfoveal. Central Foveal Thickness: 276. Findings include abnormal foveal contour, subretinal hyper-reflective material, intraretinal hyper-reflective material.   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  Forms of therapy reviewed include the use of Anti-VEGF medications injected painlessly into the eye, as well as other possible treatment modalities, including thermal laser therapy. Fellow eye involvement and risks were discussed with the patient. Upon the finding of wet age related macular degeneration, treatment will be offered. The treatment regimen is on a treat as needed basis with the intent to treat if necessary and extend interval of exams when possible. On average 1 out  of 6 patients do not need lifetime therapy. However, the risk of recurrent disease is high for a lifetime.  Initially monthly, then periodic, examinations and evaluations will determine whether the next treatment is required on the Kimbly Eanes of the examination. Treat tomorrow.  Exudative age-related macular degeneration of left eye with active choroidal neovascularization (HCC) The nature of wet macular degeneration was discussed with the patient.  Forms of therapy reviewed include the use of Anti-VEGF medications injected painlessly into the eye, as well as other possible treatment modalities, including thermal laser therapy. Fellow eye involvement and risks were discussed with the patient. Upon the finding of wet age related macular degeneration, treatment will be offered. The treatment regimen is on a treat as needed basis with the intent to treat if necessary and extend interval of exams when possible. On average 1 out of 6 patients do not need lifetime therapy. However, the risk of recurrent disease is high for a lifetime.  Initially monthly, then periodic, examinations and evaluations will determine whether the next treatment is required on the [place] of the examination. Treat soon      ICD-10-CM   1. Exudative age-related macular degeneration of right eye with active choroidal neovascularization (HCC)  H35.3211 OCT, Retina - OU - Both Eyes  2. Exudative age-related macular degeneration of  left eye with active choroidal neovascularization (HCC)  H35.3221 OCT, Retina - OU - Both Eyes    1.  Findings reviewed with the patient risk benefits of therapy reviewed.  We will commence therapy promptly right eye to hasten resolution offer the potential for visual acuity stabilization if not improvement  2.  I displayed the OCT pictures of each eye to the patient and the also understands the left eye will need to commence therapy promptly  3.  Ophthalmic Meds Ordered this visit:  No orders of the defined types were placed in this encounter.      Return in about 1 Marquay Kruse (around 02/13/2021) for NO DILATE, AVASTIN OCT, OD.  There are no Patient Instructions on file for this visit.   Explained the diagnoses, plan, and follow up with the patient and they expressed understanding.  Patient expressed understanding of the importance of proper follow up care.   Alford Highland Rankin M.D. Diseases & Surgery of the Retina and Vitreous [facility] 02/12/21     Abbreviations: M myopia (nearsighted); A astigmatism; H hyperopia (farsighted); P presbyopia; Mrx spectacle prescription;  CTL contact lenses; OD right eye; OS left eye; OU both eyes  XT exotropia; ET esotropia; PEK punctate epithelial keratitis; PEE punctate epithelial erosions; DES dry eye syndrome; MGD meibomian gland dysfunction; ATs artificial tears; PFAT's preservative free artificial tears; NSC nuclear sclerotic cataract; PSC posterior subcapsular cataract; ERM epi-retinal membrane; PVD posterior vitreous detachment; RD retinal detachment; DM diabetes mellitus; DR diabetic retinopathy; NPDR non-proliferative diabetic retinopathy; PDR proliferative diabetic retinopathy; CSME clinically significant macular edema; DME diabetic macular edema; dbh dot blot hemorrhages; CWS cotton wool spot; POAG primary open angle glaucoma; C/D cup-to-disc ratio; HVF humphrey visual field; GVF goldmann visual field; OCT optical coherence  tomography; IOP intraocular pressure; BRVO Branch retinal vein occlusion; CRVO central retinal vein occlusion; CRAO central retinal artery occlusion; BRAO branch retinal artery occlusion; RT retinal tear; SB scleral buckle; PPV pars plana vitrectomy; VH Vitreous hemorrhage; PRP panretinal laser photocoagulation; IVK intravitreal kenalog; VMT vitreomacular traction; MH Macular hole;  NVD neovascularization of the disc; NVE neovascularization elsewhere; AREDS age related eye disease study; ARMD age related macular  degeneration; POAG primary open angle glaucoma; EBMD epithelial/anterior basement membrane dystrophy; ACIOL anterior chamber intraocular lens; IOL intraocular lens; PCIOL posterior chamber intraocular lens; Phaco/IOL phacoemulsification with intraocular lens placement; [place] photorefractive keratectomy; LASIK laser assisted in situ keratomileusis; HTN hypertension; DM diabetes mellitus; COPD chronic obstructive pulmonary disease

## 2021-02-12 NOTE — Assessment & Plan Note (Signed)
The nature of wet macular degeneration was discussed with the patient.  Forms of therapy reviewed include the use of Anti-VEGF medications injected painlessly into the eye, as well as other possible treatment modalities, including thermal laser therapy. Fellow eye involvement and risks were discussed with the patient. Upon the finding of wet age related macular degeneration, treatment will be offered. The treatment regimen is on a treat as needed basis with the intent to treat if necessary and extend interval of exams when possible. On average 1 out of 6 patients do not need lifetime therapy. However, the risk of recurrent disease is high for a lifetime.  Initially monthly, then periodic, examinations and evaluations will determine whether the next treatment is required on the day of the examination. Treat tomorrow.

## 2021-02-12 NOTE — Assessment & Plan Note (Signed)
The nature of wet macular degeneration was discussed with the patient.  Forms of therapy reviewed include the use of Anti-VEGF medications injected painlessly into the eye, as well as other possible treatment modalities, including thermal laser therapy. Fellow eye involvement and risks were discussed with the patient. Upon the finding of wet age related macular degeneration, treatment will be offered. The treatment regimen is on a treat as needed basis with the intent to treat if necessary and extend interval of exams when possible. On average 1 out of 6 patients do not need lifetime therapy. However, the risk of recurrent disease is high for a lifetime.  Initially monthly, then periodic, examinations and evaluations will determine whether the next treatment is required on the day of the examination. Treat soon

## 2021-02-13 ENCOUNTER — Encounter (INDEPENDENT_AMBULATORY_CARE_PROVIDER_SITE_OTHER): Payer: Self-pay | Admitting: Ophthalmology

## 2021-02-13 ENCOUNTER — Ambulatory Visit (INDEPENDENT_AMBULATORY_CARE_PROVIDER_SITE_OTHER): Payer: Medicare Other | Admitting: Ophthalmology

## 2021-02-13 DIAGNOSIS — H353211 Exudative age-related macular degeneration, right eye, with active choroidal neovascularization: Secondary | ICD-10-CM | POA: Diagnosis not present

## 2021-02-13 MED ORDER — BEVACIZUMAB 2.5 MG/0.1ML IZ SOSY
2.5000 mg | PREFILLED_SYRINGE | INTRAVITREAL | Status: AC | PRN
Start: 2021-02-13 — End: 2021-02-13
  Administered 2021-02-13: 2.5 mg via INTRAVITREAL

## 2021-02-13 NOTE — Progress Notes (Signed)
02/13/2021     CHIEF COMPLAINT Patient presents for Retina Follow Up (1 Day Avastin OD, no dilation//Pt denies noticeable changes to [place] OU since last visit. Pt denies ocular pain, flashes of light, or floaters OU. //)   HISTORY OF PRESENT ILLNESS: Victor Walker is a 65 y.o. male who presents to the clinic today for:   HPI    Retina Follow Up    Patient presents with  Wet AMD.  In right eye.  This started 1 day ago.  Severity is mild.  Duration of 1 day.  Since onset it is stable. Additional comments: 1 Day Avastin OD, no dilation  Pt denies noticeable changes to [place] OU since last visit. Pt denies ocular pain, flashes of light, or floaters OU.          Last edited by Ileana Roup, COA on 02/13/2021  8:43 AM. (History)      Referring physician: [name], MD [address] [place],  [place] [address]  HISTORICAL INFORMATION:   Selected notes from the MEDICAL RECORD NUMBER       CURRENT MEDICATIONS: No current outpatient medications on file. (Ophthalmic Drugs)   No current facility-administered medications for this visit. (Ophthalmic Drugs)   Current Outpatient Medications (Other)  Medication Sig  . atorvastatin (LIPITOR) 20 MG tablet Take 1 tablet by mouth daily.  . clopidogrel (PLAVIX) 75 MG tablet Take 1 tablet (75 mg total) by mouth daily. (Patient not taking: Reported on 01/22/2021)  . metoprolol tartrate (LOPRESSOR) 25 MG tablet Take 1 tablet (25 mg total) by mouth 2 (two) times daily.  . nitroGLYCERIN (NITROSTAT) 0.4 MG SL tablet Place 1 tablet (0.4 mg total) under the tongue every 5 (five) minutes as needed for chest pain.  . traMADol (ULTRAM) 50 MG tablet Take 1-2 tablets by mouth as needed.   No current facility-administered medications for this visit. (Other)      REVIEW OF SYSTEMS:    ALLERGIES Allergies  Allergen Reactions  . Aspirin Anaphylaxis  . Penicillins Anaphylaxis    Has patient had a PCN reaction causing  immediate rash, facial/tongue/throat swelling, SOB or lightheadedness with hypotension:YES Has patient had a PCN reaction causing severe rash involving mucus membranes or skin necrosis: NO Has patient had a PCN reaction that required hospitalization NO Has patient had a PCN reaction occurring within the last 10 years:NO If all of the above answers are "NO", then may proceed with Cephalosporin use.  Marland Kitchen Shrimp [Shellfish Allergy] Anaphylaxis    PAST MEDICAL HISTORY Past Medical History:  Diagnosis Date  . H/O bilateral inguinal hernia repair   . TB (pulmonary tuberculosis)    Past Surgical History:  Procedure Laterality Date  . CATARACT EXTRACTION Right 01/29/2021   Dr. [name]  . CATARACT EXTRACTION Left 02/05/2021   Dr. [name]  . HERNIA REPAIR    . KIDNEY STONE SURGERY      FAMILY HISTORY Family History  Problem Relation Age of Onset  . Diabetes Mother   . Hypertension Mother     SOCIAL HISTORY Social History   Tobacco Use  . Smoking status: Current Every Day Smoker  . Smokeless tobacco: Never Used  Vaping Use  . Vaping Use: Never used  Substance Use Topics  . Alcohol use: Yes  . Drug use: Yes    Types: Marijuana         OPHTHALMIC EXAM: Base Eye Exam    Visual Acuity (ETDRS)  Right Left   Dist [place] 20/80 -3 20/50   Dist ph [place] 20/25       Tonometry (Tonopen, 8:43 AM)      Right Left   Pressure 12 15       Pupils      Dark Light Shape React APD   Right 4 4 Round Minimal None   Left 3 3 Round Minimal None       Visual Fields (Counting fingers)      Left Right    Full Full       Extraocular Movement      Right Left    Full Full       Neuro/Psych    Oriented x3: Yes   Mood/Affect: Normal       Dilation   Defer per GAR          IMAGING AND PROCEDURES  Imaging and Procedures for 02/13/21  OCT, Retina - OU - Both Eyes       Right Eye Quality was good. Scan locations included subfoveal. Central Foveal Thickness: 479.  Findings include abnormal foveal contour, choroidal neovascular membrane, intraretinal fluid, subretinal fluid.   Left Eye Quality was good. Scan locations included subfoveal. Central Foveal Thickness: 276. Findings include abnormal foveal contour, subretinal hyper-reflective material, intraretinal hyper-reflective material.   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 9:14 AM. Confirmed correct patient, procedure, site, and patient consented.   Anesthesia Topical anesthesia was used. Anesthetic medications included Akten 3.5%.   Procedure Preparation included Ofloxacin . A 30 gauge needle was used.   Injection:  2.5 mg Bevacizumab (AVASTIN) 2.5mg /0.42mL SOSY   NDC: 27782-423-53, Lot: [id number]   Route: Intravitreal, Site: Right Eye  Post-op Post injection exam found visual acuity of at least counting fingers. The patient tolerated the procedure well. There were no complications. The patient received written and verbal post procedure care education. Post injection medications were not given.                 ASSESSMENT/PLAN:  No problem-specific Assessment & Plan notes found for this encounter.      ICD-10-CM   1. Exudative age-related macular degeneration of right eye with active choroidal neovascularization (HCC)  H35.3211 OCT, Retina - OU - Both Eyes    Intravitreal Injection, Pharmacologic Agent - OD - Right Eye    bevacizumab (AVASTIN) SOSY 2.5 mg    1.  2.  3.  Ophthalmic Meds Ordered this visit:  Meds ordered this encounter  Medications  . bevacizumab (AVASTIN) SOSY 2.5 mg       Return Early next week, for OS, dilate, AVASTIN OCT, As scheduled.  There are no Patient Instructions on file for this  visit.   Explained the diagnoses, plan, and follow up with the patient and they expressed understanding.  Patient expressed understanding of the importance of proper follow up care.   Alford Highland Tania Steinhauser M.D. Diseases & Surgery of the Retina and Vitreous [facility] 02/13/21     Abbreviations: M myopia (nearsighted); A astigmatism; H hyperopia (farsighted); P presbyopia; Mrx spectacle prescription;  CTL contact lenses; OD right eye; OS left eye; OU both eyes  XT exotropia; ET esotropia;  PEK punctate epithelial keratitis; PEE punctate epithelial erosions; DES dry eye syndrome; MGD meibomian gland dysfunction; ATs artificial tears; PFAT's preservative free artificial tears; NSC nuclear sclerotic cataract; PSC posterior subcapsular cataract; ERM epi-retinal membrane; PVD posterior vitreous detachment; RD retinal detachment; DM diabetes mellitus; DR diabetic retinopathy; NPDR non-proliferative diabetic retinopathy; PDR proliferative diabetic retinopathy; CSME clinically significant macular edema; DME diabetic macular edema; dbh dot blot hemorrhages; CWS cotton wool spot; POAG primary open angle glaucoma; C/D cup-to-disc ratio; HVF humphrey visual field; GVF goldmann visual field; OCT optical coherence tomography; IOP intraocular pressure; BRVO Branch retinal vein occlusion; CRVO central retinal vein occlusion; CRAO central retinal artery occlusion; BRAO branch retinal artery occlusion; RT retinal tear; SB scleral buckle; PPV pars plana vitrectomy; VH Vitreous hemorrhage; PRP panretinal laser photocoagulation; IVK intravitreal kenalog; VMT vitreomacular traction; MH Macular hole;  NVD neovascularization of the disc; NVE neovascularization elsewhere; AREDS age related eye disease study; ARMD age related macular degeneration; POAG primary open angle glaucoma; EBMD epithelial/anterior basement membrane dystrophy; ACIOL anterior chamber intraocular lens; IOL intraocular lens; PCIOL posterior chamber  intraocular lens; Phaco/IOL phacoemulsification with intraocular lens placement; PRK photorefractive keratectomy; LASIK laser assisted in situ keratomileusis; HTN hypertension; DM diabetes mellitus; COPD chronic obstructive pulmonary disease

## 2021-02-19 ENCOUNTER — Ambulatory Visit (INDEPENDENT_AMBULATORY_CARE_PROVIDER_SITE_OTHER): Payer: Medicare Other | Admitting: Ophthalmology

## 2021-02-19 ENCOUNTER — Encounter (INDEPENDENT_AMBULATORY_CARE_PROVIDER_SITE_OTHER): Payer: Self-pay | Admitting: Ophthalmology

## 2021-02-19 ENCOUNTER — Other Ambulatory Visit: Payer: Self-pay

## 2021-02-19 DIAGNOSIS — H353221 Exudative age-related macular degeneration, left eye, with active choroidal neovascularization: Secondary | ICD-10-CM | POA: Diagnosis not present

## 2021-02-19 DIAGNOSIS — H353211 Exudative age-related macular degeneration, right eye, with active choroidal neovascularization: Secondary | ICD-10-CM

## 2021-02-19 NOTE — Assessment & Plan Note (Signed)
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

## 2021-02-19 NOTE — Assessment & Plan Note (Signed)
Macular findings on the right I have slightly improved 6 days post recent injection #1 of Avastin for wet ARMD which occurred in the subfoveal location.  Visual acuities remained stable

## 2021-02-19 NOTE — Progress Notes (Addendum)
02/19/2021     CHIEF COMPLAINT Patient presents for Retina Follow Up (1 Wk Avastin OS, no dilation//Pt sts OD was throbbing yesterday, but after laying down, it improved. Pt sts floaters OD have improved. Pt sts he still sees "strobes" upon waking OU off and on. Pt sts flashes have "slowed down" OU.)   HISTORY OF PRESENT ILLNESS: Victor Walker is a 65 y.o. male who presents to the clinic today for:   HPI    Retina Follow Up    Patient presents with  Wet AMD.  In left eye.  This started 1 week ago.  Severity is mild.  Duration of 1 week.  Since onset it is stable. Additional comments: 1 Wk Avastin OS, no dilation  Pt sts OD was throbbing yesterday, but after laying down, it improved. Pt sts floaters OD have improved. Pt sts he still sees "strobes" upon waking OU off and on. Pt sts flashes have "slowed down" OU.       Last edited by Ileana Roup, COA on 02/19/2021  8:54 AM. (History)      Referring physician: [name], MD [address] [place],  [place] [address]  HISTORICAL INFORMATION:   Selected notes from the MEDICAL RECORD NUMBER       CURRENT MEDICATIONS: Current Outpatient Medications (Ophthalmic Drugs)  Medication Sig  . ofloxacin (OCUFLOX) 0.3 % ophthalmic solution   . prednisoLONE acetate (PRED FORTE) 1 % ophthalmic suspension SMARTSIG:In Eye(s)   No current facility-administered medications for this visit. (Ophthalmic Drugs)   Current Outpatient Medications (Other)  Medication Sig  . atorvastatin (LIPITOR) 20 MG tablet Take 1 tablet by mouth daily.  . clopidogrel (PLAVIX) 75 MG tablet Take 1 tablet (75 mg total) by mouth daily. (Patient not taking: Reported on 01/22/2021)  . metoprolol tartrate (LOPRESSOR) 25 MG tablet Take 1 tablet (25 mg total) by mouth 2 (two) times daily.  . nitroGLYCERIN (NITROSTAT) 0.4 MG SL tablet Place 1 tablet (0.4 mg total) under the tongue every 5 (five) minutes as needed for chest pain.  . traMADol  (ULTRAM) 50 MG tablet Take 1-2 tablets by mouth as needed.   No current facility-administered medications for this visit. (Other)      REVIEW OF SYSTEMS:    ALLERGIES Allergies  Allergen Reactions  . Aspirin Anaphylaxis  . Penicillins Anaphylaxis    Has patient had a PCN reaction causing immediate rash, facial/tongue/throat swelling, SOB or lightheadedness with hypotension:YES Has patient had a PCN reaction causing severe rash involving mucus membranes or skin necrosis: NO Has patient had a PCN reaction that required hospitalization NO Has patient had a PCN reaction occurring within the last 10 years:NO If all of the above answers are "NO", then may proceed with Cephalosporin use.  Marland Kitchen Shrimp [Shellfish Allergy] Anaphylaxis    PAST MEDICAL HISTORY Past Medical History:  Diagnosis Date  . H/O bilateral inguinal hernia repair   . TB (pulmonary tuberculosis)    Past Surgical History:  Procedure Laterality Date  . CATARACT EXTRACTION Right 01/29/2021   Dr. [name]  . CATARACT EXTRACTION Left 02/05/2021   Dr. [name]  . HERNIA REPAIR    . KIDNEY STONE SURGERY      FAMILY HISTORY Family History  Problem Relation Age of Onset  . Diabetes Mother   . Hypertension Mother     SOCIAL HISTORY Social History   Tobacco Use  . Smoking status: Current Every Day Smoker  . Smokeless tobacco: Never Used  Vaping Use  . Vaping Use: Never used  Substance Use Topics  . Alcohol use: Yes  . Drug use: Yes    Types: Marijuana         OPHTHALMIC EXAM:  Base Eye Exam    Visual Acuity (ETDRS)      Right Left   Dist [place] 20/80 -2 20/40 +2   Dist ph [place] NI 20/25 +2       Tonometry (Tonopen, 8:55 AM)      Right Left   Pressure 16 13       Pupils      Dark Light Shape React APD   Right 3 3 Round Minimal None   Left 4 4 Round Minimal None       Visual Fields (Counting fingers)      Left Right    Full Full       Extraocular Movement      Right Left    Full Full        Neuro/Psych    Oriented x3: Yes   Mood/Affect: Normal       Dilation   Defer per GAR          IMAGING AND PROCEDURES  Imaging and Procedures for 02/26/21  OCT, Retina - OU - Both Eyes       Right Eye Quality was good. Scan locations included subfoveal. Central Foveal Thickness: 425. Findings include abnormal foveal contour, choroidal neovascular membrane, intraretinal fluid, subretinal fluid.   Left Eye Quality was good. Scan locations included subfoveal. Central Foveal Thickness: 282. Findings include abnormal foveal contour, subretinal hyper-reflective material, intraretinal hyper-reflective material.   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 9:33 AM. Confirmed correct patient, procedure, site, and patient consented.   Anesthesia Topical anesthesia was used. Anesthetic medications included Akten 3.5%.   Procedure Preparation included Tobramycin 0.3%, Ofloxacin , 10% betadine to eyelids, 5% betadine to ocular surface. A 30 gauge needle was used.   Injection:  2.5 mg Bevacizumab (AVASTIN) 2.5mg /0.35mL SOSY   NDC: 27062-376-28, Lot: [id number]   Route: Intravitreal, Site: Left Eye  Post-op Post injection exam found visual acuity of at least counting fingers. The patient tolerated the procedure well. There were no complications. The patient received written and verbal post procedure care education. Post injection medications were not given.                 ASSESSMENT/PLAN:  Exudative age-related macular degeneration of right eye with active choroidal neovascularization (HCC) Macular findings on the  right I have slightly improved 6 days post recent injection #1 of Avastin for wet ARMD which occurred in the subfoveal location.  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 Exudative age-related macular degeneration of left eye with active choroidal neovascularization (HCC)  H35.3221 OCT, Retina - OU - Both Eyes    Intravitreal Injection, Pharmacologic Agent - OS - Left Eye  bevacizumab (AVASTIN) SOSY 2.5 mg  2. Exudative age-related macular degeneration of right eye with active choroidal neovascularization (HCC)  H35.3211     1.  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  Dilate OD next as scheduled and Avastin OD  3.  Ophthalmic Meds Ordered this visit:  Meds ordered this encounter  Medications  . bevacizumab (AVASTIN) SOSY 2.5 mg       Return in about 6 weeks (around 04/02/2021), or And right eye as scheduled injection Avastin, for dilate, OS, AVASTIN OCT.  There are no Patient Instructions on file for this visit.   Explained the diagnoses, plan, and follow up with the patient and they expressed understanding.  Patient expressed understanding of the importance of proper follow up care.   Alford Highland Marris Frontera M.D. Diseases & Surgery of the Retina and Vitreous [facility] 02/26/21     Abbreviations: M myopia (nearsighted); A astigmatism; H hyperopia (farsighted); P presbyopia; Mrx spectacle prescription;  CTL contact lenses; OD right eye; OS left eye; OU both eyes  XT exotropia; ET esotropia; PEK punctate epithelial keratitis; PEE punctate epithelial erosions; DES dry eye syndrome; MGD meibomian gland dysfunction; ATs artificial tears; PFAT's  preservative free artificial tears; NSC nuclear sclerotic cataract; PSC posterior subcapsular cataract; ERM epi-retinal membrane; PVD posterior vitreous detachment; RD retinal detachment; DM diabetes mellitus; DR diabetic retinopathy; NPDR non-proliferative diabetic retinopathy; PDR proliferative diabetic retinopathy; CSME clinically significant macular edema; DME diabetic macular edema; dbh dot blot hemorrhages; CWS cotton wool spot; POAG primary open angle glaucoma; C/D cup-to-disc ratio; HVF humphrey visual field; GVF goldmann visual field; OCT optical coherence tomography; IOP intraocular pressure; BRVO Branch retinal vein occlusion; CRVO central retinal vein occlusion; CRAO central retinal artery occlusion; BRAO branch retinal artery occlusion; RT retinal tear; SB scleral buckle; PPV pars plana vitrectomy; VH Vitreous hemorrhage; PRP panretinal laser photocoagulation; IVK intravitreal kenalog; VMT vitreomacular traction; MH Macular hole;  NVD neovascularization of the disc; NVE neovascularization elsewhere; AREDS age related eye disease study; ARMD age related macular degeneration; POAG primary open angle glaucoma; EBMD epithelial/anterior basement membrane dystrophy; ACIOL anterior chamber intraocular lens; IOL intraocular lens; PCIOL posterior chamber intraocular lens; Phaco/IOL phacoemulsification with intraocular lens placement; PRK photorefractive keratectomy; LASIK laser assisted in situ keratomileusis; HTN hypertension; DM diabetes mellitus; COPD chronic obstructive pulmonary disease

## 2021-02-20 ENCOUNTER — Ambulatory Visit: Payer: Medicare Other | Admitting: Cardiology

## 2021-02-20 ENCOUNTER — Ambulatory Visit (INDEPENDENT_AMBULATORY_CARE_PROVIDER_SITE_OTHER): Payer: Medicare Other | Admitting: Ophthalmology

## 2021-02-20 ENCOUNTER — Encounter (INDEPENDENT_AMBULATORY_CARE_PROVIDER_SITE_OTHER): Payer: Self-pay | Admitting: Ophthalmology

## 2021-02-20 DIAGNOSIS — S0501XA Injury of conjunctiva and corneal abrasion without foreign body, right eye, initial encounter: Secondary | ICD-10-CM

## 2021-02-20 DIAGNOSIS — H353211 Exudative age-related macular degeneration, right eye, with active choroidal neovascularization: Secondary | ICD-10-CM | POA: Diagnosis not present

## 2021-02-20 DIAGNOSIS — H353221 Exudative age-related macular degeneration, left eye, with active choroidal neovascularization: Secondary | ICD-10-CM

## 2021-02-20 HISTORY — DX: Injury of conjunctiva and corneal abrasion without foreign body, right eye, initial encounter: S05.01XA

## 2021-02-20 NOTE — Assessment & Plan Note (Signed)
stable °

## 2021-02-20 NOTE — Assessment & Plan Note (Signed)
Sensation of the right eye improved immediately after placement of numbing drop to measure the intraocular pressure.  Last patient to try to just take it easy today keep his eye mostly closed and use moisturizing teardrops of the right eye today and this should improve overnight

## 2021-02-20 NOTE — Progress Notes (Signed)
02/20/2021     CHIEF COMPLAINT Patient presents for Eye Pain (Pt c/o OD being painful upon waking up this morning. Pt states pain has decreased since this AM but it is still painful. Pt describes pain as "stabbing, irritated and tearing". Pt states occasionally when he brings an object toward his face OU become painful.)   HISTORY OF PRESENT ILLNESS: Victor Walker is a 65 y.o. male who presents to the clinic today for:   HPI    Eye Pain      In right eye.  Characterized as irritation and sharp pain.  Pain was noted as 3/10.  Occurring constantly.  It is worse throughout the day.  Duration of 4 hours.  Associated symptoms include blurred vision and tearing.  Treatments tried include no treatments.  Response to treatment was no improvement.  I, the attending physician,  performed the HPI with the patient and updated documentation appropriately. Additional comments: Pt c/o OD being painful upon waking up this morning. Pt states pain has decreased since this AM but it is still painful. Pt describes pain as "stabbing, irritated and tearing". Pt states occasionally when he brings an object toward his face OU become painful.       Last edited by Elyse Jarvis on 02/20/2021  9:43 AM. (History)      Referring physician: [name], MD [address] [place],  [place] [address]  HISTORICAL INFORMATION:   Selected notes from the MEDICAL RECORD NUMBER       CURRENT MEDICATIONS: Current Outpatient Medications (Ophthalmic Drugs)  Medication Sig  . ofloxacin (OCUFLOX) 0.3 % ophthalmic solution   . prednisoLONE acetate (PRED FORTE) 1 % ophthalmic suspension SMARTSIG:In Eye(s)   No current facility-administered medications for this visit. (Ophthalmic Drugs)   Current Outpatient Medications (Other)  Medication Sig  . atorvastatin (LIPITOR) 20 MG tablet Take 1 tablet by mouth daily.  . clopidogrel (PLAVIX) 75 MG tablet Take 1 tablet (75 mg total) by mouth  daily. (Patient not taking: Reported on 01/22/2021)  . metoprolol tartrate (LOPRESSOR) 25 MG tablet Take 1 tablet (25 mg total) by mouth 2 (two) times daily.  . nitroGLYCERIN (NITROSTAT) 0.4 MG SL tablet Place 1 tablet (0.4 mg total) under the tongue every 5 (five) minutes as needed for chest pain.  . traMADol (ULTRAM) 50 MG tablet Take 1-2 tablets by mouth as needed.   No current facility-administered medications for this visit. (Other)      REVIEW OF SYSTEMS:    ALLERGIES Allergies  Allergen Reactions  . Aspirin Anaphylaxis  . Penicillins Anaphylaxis    Has patient had a PCN reaction causing immediate rash, facial/tongue/throat swelling, SOB or lightheadedness with hypotension:YES Has patient had a PCN reaction causing severe rash involving mucus membranes or skin necrosis: NO Has patient had a PCN reaction that required hospitalization NO Has patient had a PCN reaction occurring within the last 10 years:NO If all of the above answers are "NO", then may proceed with Cephalosporin use.  Marland Kitchen Shrimp [Shellfish Allergy] Anaphylaxis    PAST MEDICAL HISTORY Past Medical History:  Diagnosis Date  . H/O bilateral inguinal hernia repair   . TB (pulmonary tuberculosis)    Past Surgical History:  Procedure Laterality Date  . CATARACT EXTRACTION Right 01/29/2021   Dr. [name]  . CATARACT EXTRACTION Left 02/05/2021   Dr. [name]  . HERNIA REPAIR    . KIDNEY STONE SURGERY      FAMILY HISTORY Family History  Problem Relation Age of Onset  . Diabetes Mother   . Hypertension Mother     SOCIAL HISTORY Social History   Tobacco Use  . Smoking status: Current Every Day Smoker  . Smokeless tobacco: Never Used  Vaping Use  . Vaping Use: Never used  Substance Use Topics  . Alcohol use: Yes  . Drug use: Yes    Types: Marijuana         OPHTHALMIC EXAM:  Base Eye Exam    Visual Acuity (Snellen - Linear)      Right Left   Dist [place] 20/400 20/50 +2   Dist ph [place]  20/25 -1        Tonometry (Tonopen, 9:47 AM)      Right Left   Pressure 5 6       Neuro/Psych    Oriented x3: Yes   Mood/Affect: Normal       Dilation    Right eye: 1.0% Mydriacyl, 2.5% Phenylephrine @ 9:48 AM        Slit Lamp and Fundus Exam    External Exam      Right Left   External Normal Normal       Slit Lamp Exam      Right Left   Lids/Lashes Normal Normal   Conjunctiva/Sclera White and quiet White and quiet   Cornea Clear, no epi defects Clear   Anterior Chamber Deep and quiet,,  no cells,  Deep and quiet   Iris Round and reactive Round and reactive   Lens Centered posterior chamber intraocular lens Centered posterior chamber intraocular lens   Anterior Vitreous Normal Normal       Fundus Exam      Right Left   Posterior Vitreous Posterior vitreous detachment Posterior vitreous detachment   Disc Normal Normal   C/D Ratio 0.2 0.2   Macula Macular thickening, Hard drusen, Retinal pigment epithelial mottling, Retinal pigment epithelial detachment, Subretinal neovascular membrane Pigmented nodule superonasal to the macula foveal region left eyeSmall   Vessels Normal    Periphery Normal           IMAGING AND PROCEDURES  Imaging and Procedures for 02/20/21  OCT, Retina - OU - Both Eyes       Right Eye Quality was good. Scan locations included subfoveal. Central Foveal Thickness: 412. Progression has improved. Findings include abnormal foveal contour.   Left Eye Quality was good. Scan locations included subfoveal. Central Foveal Thickness: 285. Progression has improved. Findings include abnormal foveal contour.   Notes Improving macular condition in each eye post recent Avastin                ASSESSMENT/PLAN:  Corneal abrasion, right, initial encounter Sensation of the right eye improved immediately after placement of numbing drop to measure the intraocular pressure.  Last patient to try to just take it easy today keep his eye mostly closed and use  moisturizing teardrops of the right eye today and this should improve overnight  Exudative age-related macular degeneration of left eye with active choroidal neovascularization (HCC) stable      ICD-10-CM   1. Exudative age-related macular degeneration of right eye with active choroidal neovascularization (HCC)  H35.3211 OCT, Retina - OU - Both Eyes  2. Corneal abrasion, right, initial encounter  S05.01XA   3. Exudative age-related macular degeneration of left eye with active choroidal neovascularization (HCC)  H35.3221     1.  2.  3.  Ophthalmic Meds Ordered this visit:  No orders  of the defined types were placed in this encounter.      Return for As scheduled.  There are no Patient Instructions on file for this visit.   Explained the diagnoses, plan, and follow up with the patient and they expressed understanding.  Patient expressed understanding of the importance of proper follow up care.   Alford Highland Jaysun Wessels M.D. Diseases & Surgery of the Retina and Vitreous [facility] 02/20/21     Abbreviations: M myopia (nearsighted); A astigmatism; H hyperopia (farsighted); P presbyopia; Mrx spectacle prescription;  CTL contact lenses; OD right eye; OS left eye; OU both eyes  XT exotropia; ET esotropia; PEK punctate epithelial keratitis; PEE punctate epithelial erosions; DES dry eye syndrome; MGD meibomian gland dysfunction; ATs artificial tears; PFAT's preservative free artificial tears; NSC nuclear sclerotic cataract; PSC posterior subcapsular cataract; ERM epi-retinal membrane; PVD posterior vitreous detachment; RD retinal detachment; DM diabetes mellitus; DR diabetic retinopathy; NPDR non-proliferative diabetic retinopathy; PDR proliferative diabetic retinopathy; CSME clinically significant macular edema; DME diabetic macular edema; dbh dot blot hemorrhages; CWS cotton wool spot; POAG primary open angle glaucoma; C/D cup-to-disc ratio; HVF humphrey visual field; GVF  goldmann visual field; OCT optical coherence tomography; IOP intraocular pressure; BRVO Branch retinal vein occlusion; CRVO central retinal vein occlusion; CRAO central retinal artery occlusion; BRAO branch retinal artery occlusion; RT retinal tear; SB scleral buckle; PPV pars plana vitrectomy; VH Vitreous hemorrhage; PRP panretinal laser photocoagulation; IVK intravitreal kenalog; VMT vitreomacular traction; MH Macular hole;  NVD neovascularization of the disc; NVE neovascularization elsewhere; AREDS age related eye disease study; ARMD age related macular degeneration; POAG primary open angle glaucoma; EBMD epithelial/anterior basement membrane dystrophy; ACIOL anterior chamber intraocular lens; IOL intraocular lens; PCIOL posterior chamber intraocular lens; Phaco/IOL phacoemulsification with intraocular lens placement; PRK photorefractive keratectomy; LASIK laser assisted in situ keratomileusis; HTN hypertension; DM diabetes mellitus; COPD chronic obstructive pulmonary disease

## 2021-02-26 DIAGNOSIS — H353221 Exudative age-related macular degeneration, left eye, with active choroidal neovascularization: Secondary | ICD-10-CM | POA: Diagnosis not present

## 2021-02-26 MED ORDER — BEVACIZUMAB 2.5 MG/0.1ML IZ SOSY
2.5000 mg | PREFILLED_SYRINGE | INTRAVITREAL | Status: AC | PRN
  Administered 2021-02-26: 2.5 mg via INTRAVITREAL

## 2021-03-10 ENCOUNTER — Ambulatory Visit: Payer: Medicare Other | Admitting: Cardiology

## 2021-03-10 NOTE — Progress Notes (Signed)
No show

## 2021-03-31 ENCOUNTER — Encounter (INDEPENDENT_AMBULATORY_CARE_PROVIDER_SITE_OTHER): Payer: Self-pay | Admitting: Ophthalmology

## 2021-03-31 ENCOUNTER — Ambulatory Visit (INDEPENDENT_AMBULATORY_CARE_PROVIDER_SITE_OTHER): Payer: Medicare Other | Admitting: Ophthalmology

## 2021-03-31 ENCOUNTER — Other Ambulatory Visit: Payer: Self-pay

## 2021-03-31 DIAGNOSIS — H353211 Exudative age-related macular degeneration, right eye, with active choroidal neovascularization: Secondary | ICD-10-CM

## 2021-03-31 DIAGNOSIS — H353221 Exudative age-related macular degeneration, left eye, with active choroidal neovascularization: Secondary | ICD-10-CM | POA: Diagnosis not present

## 2021-03-31 DIAGNOSIS — S0501XA Injury of conjunctiva and corneal abrasion without foreign body, right eye, initial encounter: Secondary | ICD-10-CM

## 2021-03-31 MED ORDER — BEVACIZUMAB 2.5 MG/0.1ML IZ SOSY
2.5000 mg | PREFILLED_SYRINGE | INTRAVITREAL | Status: AC | PRN
  Administered 2021-03-31: 2.5 mg via INTRAVITREAL

## 2021-03-31 NOTE — Assessment & Plan Note (Signed)
Improved still active superonasal to the FAZ follow-up as scheduled

## 2021-03-31 NOTE — Assessment & Plan Note (Signed)
Resolved

## 2021-03-31 NOTE — Assessment & Plan Note (Signed)
Subfoveal CNVM and subretinal fluid vastly improved post Avastin No. 1 right eye some 6 weeks previous.  Repeat intravitreal Avastin OD today

## 2021-03-31 NOTE — Progress Notes (Signed)
03/31/2021     CHIEF COMPLAINT Patient presents for Retina Follow Up (6wk fu OD/ Avastin OD/Pt states, "My OD has a curved distortion and it moves overt to the center of my vision. I feel like the clarity is better but the distortion is aggravating. I have a small gray spot in the center of my va OS."//)   HISTORY OF PRESENT ILLNESS: Victor Walker is a 65 y.o. male who presents to the clinic today for:   HPI    Retina Follow Up    Diagnosis: Wet AMD   Laterality: right eye   Onset: 6 weeks ago   Duration: 6 weeks   Comments: 6wk fu OD/ Avastin OD Pt states, "My OD has a curved distortion and it moves overt to the center of my vision. I feel like the clarity is better but the distortion is aggravating. I have a small gray spot in the center of my va OS."         Last edited by [name], COA on 03/31/2021  8:10 AM. (History)      Referring physician: [name], MD [address] [place],  [place] [address]  HISTORICAL INFORMATION:   Selected notes from the MEDICAL RECORD NUMBER       CURRENT MEDICATIONS: Current Outpatient Medications (Ophthalmic Drugs)  Medication Sig  . ofloxacin (OCUFLOX) 0.3 % ophthalmic solution   . prednisoLONE acetate (PRED FORTE) 1 % ophthalmic suspension SMARTSIG:In Eye(s)   No current facility-administered medications for this visit. (Ophthalmic Drugs)   Current Outpatient Medications (Other)  Medication Sig  . atorvastatin (LIPITOR) 20 MG tablet Take 1 tablet by mouth daily.  . clopidogrel (PLAVIX) 75 MG tablet Take 1 tablet (75 mg total) by mouth daily. (Patient not taking: Reported on 01/22/2021)  . metoprolol tartrate (LOPRESSOR) 25 MG tablet Take 1 tablet (25 mg total) by mouth 2 (two) times daily.  . nitroGLYCERIN (NITROSTAT) 0.4 MG SL tablet Place 1 tablet (0.4 mg total) under the tongue every 5 (five) minutes as needed for chest pain.  . traMADol (ULTRAM) 50 MG tablet Take 1-2 tablets by mouth as  needed.   No current facility-administered medications for this visit. (Other)      REVIEW OF SYSTEMS:    ALLERGIES Allergies  Allergen Reactions  . Aspirin Anaphylaxis  . Penicillins Anaphylaxis    Has patient had a PCN reaction causing immediate rash, facial/tongue/throat swelling, SOB or lightheadedness with hypotension:YES Has patient had a PCN reaction causing severe rash involving mucus membranes or skin necrosis: NO Has patient had a PCN reaction that required hospitalization NO Has patient had a PCN reaction occurring within the last 10 years:NO If all of the above answers are "NO", then may proceed with Cephalosporin use.  Marland Kitchen Shrimp [Shellfish Allergy] Anaphylaxis    PAST MEDICAL HISTORY Past Medical History:  Diagnosis Date  . Corneal abrasion, right, initial encounter 02/20/2021   Patient awakened this morning with a foreign body sensation in his eye.  . H/O bilateral inguinal hernia repair   . TB (pulmonary tuberculosis)    Past Surgical History:  Procedure Laterality Date  . CATARACT EXTRACTION Right 01/29/2021   Dr. [name]  . CATARACT EXTRACTION Left 02/05/2021   Dr. [name]  . HERNIA REPAIR    . KIDNEY STONE SURGERY      FAMILY HISTORY Family History  Problem Relation Age of Onset  . Diabetes Mother   . Hypertension Mother  SOCIAL HISTORY Social History   Tobacco Use  . Smoking status: Current Every Day Smoker  . Smokeless tobacco: Never Used  Vaping Use  . Vaping Use: Never used  Substance Use Topics  . Alcohol use: Yes  . Drug use: Yes    Types: Marijuana         OPHTHALMIC EXAM: Base Eye Exam    Visual Acuity (ETDRS)      Right Left   Dist [place] 20/40 -1 20/40 -2   Dist ph [place] 20/20 -1       Tonometry (Tonopen, 8:17 AM)      Right Left   Pressure 11 13       Pupils      Pupils Dark Light Shape React APD   Right PERRL 3 3 Round Minimal None   Left PERRL 4 4 Round Minimal None       Visual Fields (Counting fingers)       Left Right    Full Full       Extraocular Movement      Right Left    Full Full       Neuro/Psych    Oriented x3: Yes   Mood/Affect: Normal       Dilation    Right eye: 1.0% Mydriacyl, 2.5% Phenylephrine @ 8:17 AM        Slit Lamp and Fundus Exam    External Exam      Right Left   External Normal Normal       Slit Lamp Exam      Right Left   Lids/Lashes Normal    Conjunctiva/Sclera White and quiet    Cornea Clear, no epi defects    Anterior Chamber Deep and quiet,,  no cells,     Iris Round and reactive    Lens Centered posterior chamber intraocular lens    Anterior Vitreous Normal        Fundus Exam      Right Left   Posterior Vitreous Posterior vitreous detachment    Disc Normal    C/D Ratio 0.2    Macula Less macular thickening, Hard drusen, Retinal pigment epithelial mottling, Retinal pigment epithelial detachment, Subretinal neovascular membrane much less active    Vessels Normal    Periphery Normal           IMAGING AND PROCEDURES  Imaging and Procedures for 03/31/21  OCT, Retina - OU - Both Eyes       Right Eye Quality was good. Scan locations included subfoveal. Central Foveal Thickness: 320. Progression has improved. Findings include abnormal foveal contour.   Left Eye Quality was good. Scan locations included subfoveal. Central Foveal Thickness: 297. Progression has improved. Findings include abnormal foveal contour.   Notes Improving macular condition in each eye post recent Avastin OD, and less subretinal fluid.  OS, still active superonasal to the FAZ follow-up as scheduled       Intravitreal Injection, Pharmacologic Agent - OD - Right Eye       Time Out 03/31/2021. 8:51 AM. Confirmed correct patient, procedure, site, and patient consented.   Anesthesia Topical anesthesia was used. Anesthetic medications included Akten 3.5%.   Procedure Preparation included Ofloxacin . A 30 gauge needle was used.   Injection:  2.5 mg  Bevacizumab (AVASTIN) 2.5mg /0.20mL SOSY   NDC: 40981-191-47, Lot: [id number]   Route: Intravitreal, Site: Right Eye  Post-op Post injection exam found visual acuity of at least counting fingers. The patient tolerated the procedure  well. There were no complications. The patient received written and verbal post procedure care education. Post injection medications were not given.                 ASSESSMENT/PLAN:  Exudative age-related macular degeneration of right eye with active choroidal neovascularization (HCC) Subfoveal CNVM and subretinal fluid vastly improved post Avastin No. 1 right eye some 6 weeks previous.  Repeat intravitreal Avastin OD today  Exudative age-related macular degeneration of left eye with active choroidal neovascularization (HCC) Improved still active superonasal to the FAZ follow-up as scheduled  Corneal abrasion, right, initial encounter Resolved      ICD-10-CM   1. Exudative age-related macular degeneration of right eye with active choroidal neovascularization (HCC)  H35.3211 OCT, Retina - OU - Both Eyes    Intravitreal Injection, Pharmacologic Agent - OD - Right Eye    bevacizumab (AVASTIN) SOSY 2.5 mg  2. Exudative age-related macular degeneration of left eye with active choroidal neovascularization (HCC)  H35.3221   3. Corneal abrasion, right, initial encounter  S05.01XA     1.  Visual acuity right eye vastly improved post injection intravitreal Avastin with much less subretinal fluid OD.  We will repeat injection today to control the remainder of active disease  2.  OS overall stable yet still active will need repeat injection and evaluation as scheduled  3.  Ophthalmic Meds Ordered this visit:  Meds ordered this encounter  Medications  . bevacizumab (AVASTIN) SOSY 2.5 mg       Return in about 6 weeks (around 05/12/2021) for dilate, OD, AVASTIN OCT.  There are no Patient Instructions on file for this visit.   Explained the diagnoses, plan,  and follow up with the patient and they expressed understanding.  Patient expressed understanding of the importance of proper follow up care.   Alford Highland Anastaisa Wooding M.D. Diseases & Surgery of the Retina and Vitreous [facility] 03/31/21     Abbreviations: M myopia (nearsighted); A astigmatism; H hyperopia (farsighted); P presbyopia; Mrx spectacle prescription;  CTL contact lenses; OD right eye; OS left eye; OU both eyes  XT exotropia; ET esotropia; PEK punctate epithelial keratitis; PEE punctate epithelial erosions; DES dry eye syndrome; MGD meibomian gland dysfunction; ATs artificial tears; PFAT's preservative free artificial tears; NSC nuclear sclerotic cataract; PSC posterior subcapsular cataract; ERM epi-retinal membrane; PVD posterior vitreous detachment; RD retinal detachment; DM diabetes mellitus; DR diabetic retinopathy; NPDR non-proliferative diabetic retinopathy; PDR proliferative diabetic retinopathy; CSME clinically significant macular edema; DME diabetic macular edema; dbh dot blot hemorrhages; CWS cotton wool spot; POAG primary open angle glaucoma; C/D cup-to-disc ratio; HVF humphrey visual field; GVF goldmann visual field; OCT optical coherence tomography; IOP intraocular pressure; BRVO Branch retinal vein occlusion; CRVO central retinal vein occlusion; CRAO central retinal artery occlusion; BRAO branch retinal artery occlusion; RT retinal tear; SB scleral buckle; PPV pars plana vitrectomy; VH Vitreous hemorrhage; PRP panretinal laser photocoagulation; IVK intravitreal kenalog; VMT vitreomacular traction; MH Macular hole;  NVD neovascularization of the disc; NVE neovascularization elsewhere; AREDS age related eye disease study; ARMD age related macular degeneration; POAG primary open angle glaucoma; EBMD epithelial/anterior basement membrane dystrophy; ACIOL anterior chamber intraocular lens; IOL intraocular lens; PCIOL posterior chamber intraocular lens; Phaco/IOL  phacoemulsification with intraocular lens placement; PRK photorefractive keratectomy; LASIK laser assisted in situ keratomileusis; HTN hypertension; DM diabetes mellitus; COPD chronic obstructive pulmonary disease

## 2021-04-02 ENCOUNTER — Encounter (INDEPENDENT_AMBULATORY_CARE_PROVIDER_SITE_OTHER): Payer: Self-pay | Admitting: Ophthalmology

## 2021-04-02 ENCOUNTER — Other Ambulatory Visit: Payer: Self-pay

## 2021-04-02 ENCOUNTER — Ambulatory Visit (INDEPENDENT_AMBULATORY_CARE_PROVIDER_SITE_OTHER): Payer: Medicare Other | Admitting: Ophthalmology

## 2021-04-02 DIAGNOSIS — H353211 Exudative age-related macular degeneration, right eye, with active choroidal neovascularization: Secondary | ICD-10-CM

## 2021-04-02 DIAGNOSIS — H353221 Exudative age-related macular degeneration, left eye, with active choroidal neovascularization: Secondary | ICD-10-CM | POA: Diagnosis not present

## 2021-04-02 MED ORDER — BEVACIZUMAB 2.5 MG/0.1ML IZ SOSY
2.5000 mg | PREFILLED_SYRINGE | INTRAVITREAL | Status: AC | PRN
  Administered 2021-04-02: 2.5 mg via INTRAVITREAL

## 2021-04-02 NOTE — Assessment & Plan Note (Signed)
Much improved and OCT, continue with therapy as scheduled

## 2021-04-02 NOTE — Progress Notes (Signed)
04/02/2021     CHIEF COMPLAINT Patient presents for Retina Follow Up (6 wk fu os /Avastin OS/Pt states VA OU stable since last visit. Pt denies FOL, floaters, or ocular pain OU. /)   HISTORY OF PRESENT ILLNESS: Victor Walker is a 65 y.o. male who presents to the clinic today for:   HPI    Retina Follow Up    Diagnosis: Wet AMD   Laterality: left eye   Onset: 6 weeks ago   Severity: mild   Duration: 6 weeks   Course: stable   Comments: 6 wk fu os /Avastin OS Pt states VA OU stable since last visit. Pt denies FOL, floaters, or ocular pain OU.         Last edited by [name], COA on 04/02/2021  8:14 AM. (History)      Referring physician: [name], MD [address] [place],  [place] [address]  HISTORICAL INFORMATION:   Selected notes from the MEDICAL RECORD NUMBER       CURRENT MEDICATIONS: Current Outpatient Medications (Ophthalmic Drugs)  Medication Sig  . ofloxacin (OCUFLOX) 0.3 % ophthalmic solution   . prednisoLONE acetate (PRED FORTE) 1 % ophthalmic suspension SMARTSIG:In Eye(s)   No current facility-administered medications for this visit. (Ophthalmic Drugs)   Current Outpatient Medications (Other)  Medication Sig  . atorvastatin (LIPITOR) 20 MG tablet Take 1 tablet by mouth daily.  . clopidogrel (PLAVIX) 75 MG tablet Take 1 tablet (75 mg total) by mouth daily. (Patient not taking: Reported on 01/22/2021)  . metoprolol tartrate (LOPRESSOR) 25 MG tablet Take 1 tablet (25 mg total) by mouth 2 (two) times daily.  . nitroGLYCERIN (NITROSTAT) 0.4 MG SL tablet Place 1 tablet (0.4 mg total) under the tongue every 5 (five) minutes as needed for chest pain.  . traMADol (ULTRAM) 50 MG tablet Take 1-2 tablets by mouth as needed.   No current facility-administered medications for this visit. (Other)      REVIEW OF SYSTEMS:    ALLERGIES Allergies  Allergen Reactions  . Aspirin Anaphylaxis  . Penicillins Anaphylaxis    Has  patient had a PCN reaction causing immediate rash, facial/tongue/throat swelling, SOB or lightheadedness with hypotension:YES Has patient had a PCN reaction causing severe rash involving mucus membranes or skin necrosis: NO Has patient had a PCN reaction that required hospitalization NO Has patient had a PCN reaction occurring within the last 10 years:NO If all of the above answers are "NO", then may proceed with Cephalosporin use.  Marland Kitchen Shrimp [Shellfish Allergy] Anaphylaxis    PAST MEDICAL HISTORY Past Medical History:  Diagnosis Date  . Corneal abrasion, right, initial encounter 02/20/2021   Patient awakened this morning with a foreign body sensation in his eye.  . H/O bilateral inguinal hernia repair   . TB (pulmonary tuberculosis)    Past Surgical History:  Procedure Laterality Date  . CATARACT EXTRACTION Right 01/29/2021   Dr. [name]  . CATARACT EXTRACTION Left 02/05/2021   Dr. [name]  . HERNIA REPAIR    . KIDNEY STONE SURGERY      FAMILY HISTORY Family History  Problem Relation Age of Onset  . Diabetes Mother   . Hypertension Mother     SOCIAL HISTORY Social History   Tobacco Use  . Smoking status: Current Every Day Smoker  . Smokeless tobacco: Never Used  Vaping Use  . Vaping Use: Never used  Substance Use Topics  . Alcohol use: Yes  . Drug  use: Yes    Types: Marijuana         OPHTHALMIC EXAM: Base Eye Exam    Visual Acuity (ETDRS)      Right Left   Dist [place] 20/50 20/25   Dist ph  20/40 -2        Tonometry (Tonopen, 8:18 AM)      Right Left   Pressure 10 09       Pupils      Pupils Dark Light Shape React APD   Right PERRL 3 3 Round Minimal None   Left PERRL 4 4 Round Minimal None       Visual Fields (Counting fingers)      Left Right    Full Full       Extraocular Movement      Right Left    Full Full       Neuro/Psych    Oriented x3: Yes   Mood/Affect: Normal       Dilation    Left eye: 1.0% Mydriacyl, 2.5% Phenylephrine @  8:18 AM        Slit Lamp and Fundus Exam    External Exam      Right Left   External Normal Normal       Slit Lamp Exam      Right Left   Lids/Lashes Normal Normal   Conjunctiva/Sclera White and quiet White and quiet   Cornea Clear, no epi defects Clear   Anterior Chamber Deep and quiet,,  no cells,  Deep and quiet   Iris Round and reactive Round and reactive   Lens Centered posterior chamber intraocular lens Centered posterior chamber intraocular lens   Anterior Vitreous Normal Normal       Fundus Exam      Right Left   Posterior Vitreous  Posterior vitreous detachment   Disc  Normal   C/D Ratio  0.2   Macula  Pigmented nodule superonasal to the macula foveal region left eyeSmall   Vessels  Normal   Periphery  Normal          IMAGING AND PROCEDURES  Imaging and Procedures for 04/02/21  OCT, Retina - OU - Both Eyes       Right Eye Quality was good. Scan locations included subfoveal. Central Foveal Thickness: 312. Progression has improved. Findings include abnormal foveal contour.   Left Eye Quality was good. Scan locations included subfoveal. Central Foveal Thickness: 96. Progression has improved. Findings include abnormal foveal contour.   Notes Improving macular condition in each eye post recent Avastin OD, and less subretinal fluid.  OS, still active superonasal to the FAZ, repeat planned injection evaluation today       Intravitreal Injection, Pharmacologic Agent - OS - Left Eye       Time Out 04/02/2021. 9:04 AM. Confirmed correct patient, procedure, site, and patient consented.   Anesthesia Topical anesthesia was used. Anesthetic medications included Akten 3.5%.   Procedure Preparation included Tobramycin 0.3%, Ofloxacin , 10% betadine to eyelids, 5% betadine to ocular surface. A 30 gauge needle was used.   Injection:  2.5 mg Bevacizumab (AVASTIN) 2.5mg /0.44mL SOSY   NDC: 62130-865-78, Lot: [id number]   Route: Intravitreal, Site: Left  Eye  Post-op Post injection exam found visual acuity of at least counting fingers. The patient tolerated the procedure well. There were no complications. The patient received written and verbal post procedure care education. Post injection medications were not given.  ASSESSMENT/PLAN:  Exudative age-related macular degeneration of left eye with active choroidal neovascularization (HCC) The nature of wet macular degeneration was discussed with the patient.  Forms of therapy reviewed include the use of Anti-VEGF medications injected painlessly into the eye, as well as other possible treatment modalities, including thermal laser therapy. Fellow eye involvement and risks were discussed with the patient. Upon the finding of wet age related macular degeneration, treatment will be offered. The treatment regimen is on a treat as needed basis with the intent to treat if necessary and extend interval of exams when possible. On average 1 out of 6 patients do not need lifetime therapy. However, the risk of recurrent disease is high for a lifetime.  Initially monthly, then periodic, examinations and evaluations will determine whether the next treatment is required on the day of the examination.  OS, active extra foveal CNVM overall improved yet still active with intraretinal fluid needs repeat injection Avastin today and follow-up again OS in 5 weeks  Exudative age-related macular degeneration of right eye with active choroidal neovascularization (HCC) Much improved and OCT, continue with therapy as scheduled      ICD-10-CM   1. Exudative age-related macular degeneration of left eye with active choroidal neovascularization (HCC)  H35.3221 OCT, Retina - OU - Both Eyes    Intravitreal Injection, Pharmacologic Agent - OS - Left Eye    bevacizumab (AVASTIN) SOSY 2.5 mg  2. Exudative age-related macular degeneration of right eye with active choroidal neovascularization (HCC)  H35.3211      1.  OD, improved overall post Avastin, follow-up as scheduled next dilate OD  2.  OS, extra foveal CNVM improved with overall less active disease, at 6 weeks.  Repeat injection Avastin today and follow-up in 5 weeks  3.  Ophthalmic Meds Ordered this visit:  Meds ordered this encounter  Medications  . bevacizumab (AVASTIN) SOSY 2.5 mg       Return in about 5 weeks (around 05/07/2021) for dilate, OS, AVASTIN OCT.  There are no Patient Instructions on file for this visit.   Explained the diagnoses, plan, and follow up with the patient and they expressed understanding.  Patient expressed understanding of the importance of proper follow up care.   Alford Highland Ting Cage M.D. Diseases & Surgery of the Retina and Vitreous [facility] 04/02/21     Abbreviations: M myopia (nearsighted); A astigmatism; H hyperopia (farsighted); P presbyopia; Mrx spectacle prescription;  CTL contact lenses; OD right eye; OS left eye; OU both eyes  XT exotropia; ET esotropia; PEK punctate epithelial keratitis; PEE punctate epithelial erosions; DES dry eye syndrome; MGD meibomian gland dysfunction; ATs artificial tears; PFAT's preservative free artificial tears; NSC nuclear sclerotic cataract; PSC posterior subcapsular cataract; ERM epi-retinal membrane; PVD posterior vitreous detachment; RD retinal detachment; DM diabetes mellitus; DR diabetic retinopathy; NPDR non-proliferative diabetic retinopathy; PDR proliferative diabetic retinopathy; CSME clinically significant macular edema; DME diabetic macular edema; dbh dot blot hemorrhages; CWS cotton wool spot; POAG primary open angle glaucoma; C/D cup-to-disc ratio; HVF humphrey visual field; GVF goldmann visual field; OCT optical coherence tomography; IOP intraocular pressure; BRVO Branch retinal vein occlusion; CRVO central retinal vein occlusion; CRAO central retinal artery occlusion; BRAO branch retinal artery occlusion; RT retinal tear; SB scleral  buckle; PPV pars plana vitrectomy; VH Vitreous hemorrhage; PRP panretinal laser photocoagulation; IVK intravitreal kenalog; VMT vitreomacular traction; MH Macular hole;  NVD neovascularization of the disc; NVE neovascularization elsewhere; AREDS age related eye disease study; ARMD age related macular degeneration;  POAG primary open angle glaucoma; EBMD epithelial/anterior basement membrane dystrophy; ACIOL anterior chamber intraocular lens; IOL intraocular lens; PCIOL posterior chamber intraocular lens; Phaco/IOL phacoemulsification with intraocular lens placement; [place] photorefractive keratectomy; LASIK laser assisted in situ keratomileusis; HTN hypertension; DM diabetes mellitus; COPD chronic obstructive pulmonary disease

## 2021-04-02 NOTE — Assessment & Plan Note (Signed)
The nature of wet macular degeneration was discussed with the patient.  Forms of therapy reviewed include the use of Anti-VEGF medications injected painlessly into the eye, as well as other possible treatment modalities, including thermal laser therapy. Fellow eye involvement and risks were discussed with the patient. Upon the finding of wet age related macular degeneration, treatment will be offered. The treatment regimen is on a treat as needed basis with the intent to treat if necessary and extend interval of exams when possible. On average 1 out of 6 patients do not need lifetime therapy. However, the risk of recurrent disease is high for a lifetime.  Initially monthly, then periodic, examinations and evaluations will determine whether the next treatment is required on the day of the examination.  OS, active extra foveal CNVM overall improved yet still active with intraretinal fluid needs repeat injection Avastin today and follow-up again OS in 5 weeks

## 2021-05-07 ENCOUNTER — Encounter (INDEPENDENT_AMBULATORY_CARE_PROVIDER_SITE_OTHER): Payer: Self-pay | Admitting: Ophthalmology

## 2021-05-07 ENCOUNTER — Ambulatory Visit (INDEPENDENT_AMBULATORY_CARE_PROVIDER_SITE_OTHER): Payer: Medicare Other | Admitting: Ophthalmology

## 2021-05-07 ENCOUNTER — Other Ambulatory Visit: Payer: Self-pay

## 2021-05-07 DIAGNOSIS — H353211 Exudative age-related macular degeneration, right eye, with active choroidal neovascularization: Secondary | ICD-10-CM

## 2021-05-07 DIAGNOSIS — H353221 Exudative age-related macular degeneration, left eye, with active choroidal neovascularization: Secondary | ICD-10-CM | POA: Diagnosis not present

## 2021-05-07 MED ORDER — BEVACIZUMAB 2.5 MG/0.1ML IZ SOSY
2.5000 mg | PREFILLED_SYRINGE | INTRAVITREAL | Status: AC | PRN
  Administered 2021-05-07: 2.5 mg via INTRAVITREAL

## 2021-05-07 NOTE — Assessment & Plan Note (Signed)
Vastly improved anatomy of subfoveal lesion and subretinal fluid onset of therapy March 2022 OD, follow-up scheduled as scheduled right eye

## 2021-05-07 NOTE — Progress Notes (Signed)
05/07/2021     CHIEF COMPLAINT Patient presents for Retina Follow Up (5 Wk F/U OS, poss Avastin OS//Pt sts VA OD is still curved or warped. VA OS stable. Pt sts most of his floaters are gone OU. Pt sts floaters OS disappeared 1 week ago.)   HISTORY OF PRESENT ILLNESS: Victor Walker is a 65 y.o. male who presents to the clinic today for:   HPI     Retina Follow Up           Diagnosis: Wet AMD   Laterality: left eye   Onset: 5 weeks ago   Severity: mild   Duration: 5 weeks   Course: stable   Comments: 5 Wk F/U OS, poss Avastin OS  Pt sts VA OD is still curved or warped. VA OS stable. Pt sts most of his floaters are gone OU. Pt sts floaters OS disappeared 1 week ago.         Comments   OU with improved acuity yet right eye with still some lingering curvature, distortion.  This is not surprising in the right eye and I discussed with the patient due to the subfoveal location of disease, CNVM at the onset of findings and therapy March 2022      Last edited by Edmon Crapeankin, Karmella Bouvier A, MD on 05/07/2021  8:57 AM.      Referring physician: Verlon AuBoyd, Tammy Lamonica, MD [address] [place],  [place] [id number]  HISTORICAL INFORMATION:   Selected notes from the MEDICAL RECORD NUMBER       CURRENT MEDICATIONS: Current Outpatient Medications (Ophthalmic Drugs)  Medication Sig   ofloxacin (OCUFLOX) 0.3 % ophthalmic solution  (Patient not taking: Reported on 05/07/2021)   prednisoLONE acetate (PRED FORTE) 1 % ophthalmic suspension SMARTSIG:In Eye(s) (Patient not taking: Reported on 05/07/2021)   No current facility-administered medications for this visit. (Ophthalmic Drugs)   Current Outpatient Medications (Other)  Medication Sig   atorvastatin (LIPITOR) 20 MG tablet Take 1 tablet by mouth daily.   clopidogrel (PLAVIX) 75 MG tablet Take 1 tablet (75 mg total) by mouth daily. (Patient not taking: Reported on 01/22/2021)   metoprolol tartrate (LOPRESSOR) 25 MG tablet Take  1 tablet (25 mg total) by mouth 2 (two) times daily.   nitroGLYCERIN (NITROSTAT) 0.4 MG SL tablet Place 1 tablet (0.4 mg total) under the tongue every 5 (five) minutes as needed for chest pain.   traMADol (ULTRAM) 50 MG tablet Take 1-2 tablets by mouth as needed.   No current facility-administered medications for this visit. (Other)      REVIEW OF SYSTEMS:    ALLERGIES Allergies  Allergen Reactions   Aspirin Anaphylaxis   Penicillins Anaphylaxis    Has patient had a PCN reaction causing immediate rash, facial/tongue/throat swelling, SOB or lightheadedness with hypotension:YES Has patient had a PCN reaction causing severe rash involving mucus membranes or skin necrosis: NO Has patient had a PCN reaction that required hospitalization NO Has patient had a PCN reaction occurring within the last 10 years:NO If all of the above answers are "NO", then may proceed with Cephalosporin use.   Shrimp [Shellfish Allergy] Anaphylaxis    PAST MEDICAL HISTORY Past Medical History:  Diagnosis Date   Corneal abrasion, right, initial encounter 02/20/2021   Patient awakened this morning with a foreign body sensation in his eye.   H/O bilateral inguinal hernia repair    TB (pulmonary tuberculosis)    Past Surgical History:  Procedure Laterality Date  CATARACT EXTRACTION Right 01/29/2021   Dr. [name]   CATARACT EXTRACTION Left 02/05/2021   Dr. [name]   HERNIA REPAIR     KIDNEY STONE SURGERY      FAMILY HISTORY Family History  Problem Relation Age of Onset   Diabetes Mother    Hypertension Mother     SOCIAL HISTORY Social History   Tobacco Use   Smoking status: Every Day    Pack years: 0.00   Smokeless tobacco: Never  Vaping Use   Vaping Use: Never used  Substance Use Topics   Alcohol use: Yes   Drug use: Yes    Types: Marijuana         OPHTHALMIC EXAM:  Base Eye Exam     Visual Acuity (ETDRS)       Right Left   Dist [place] 20/40 +2 20/50 +2   Dist ph [place] 20/30 -2  20/20 -1         Tonometry (Tonopen, 8:19 AM)       Right Left   Pressure 15 12         Pupils       Pupils Dark Light Shape React APD   Right PERRL 5 5 Round Minimal None   Left PERRL 5 5 Round Minimal None         Visual Fields (Counting fingers)       Left Right    Full Full         Extraocular Movement       Right Left    Full Full         Neuro/Psych     Oriented x3: Yes   Mood/Affect: Normal         Dilation     Left eye: 1.0% Mydriacyl, 2.5% Phenylephrine @ 8:19 AM           Slit Lamp and Fundus Exam     External Exam       Right Left   External Normal Normal         Slit Lamp Exam       Right Left   Lids/Lashes Normal Normal   Conjunctiva/Sclera White and quiet White and quiet   Cornea Clear, no epi defects Clear   Anterior Chamber Deep and quiet,,  no cells,  Deep and quiet   Iris Round and reactive Round and reactive   Lens Centered posterior chamber intraocular lens Centered posterior chamber intraocular lens   Anterior Vitreous Normal Normal         Fundus Exam       Right Left   Posterior Vitreous  Posterior vitreous detachment   Disc  Normal   C/D Ratio  0.2   Macula  Pigmented nodule superonasal to the macula foveal region left eye, smaller ring of atrophy no subretinal fluid   Vessels  Normal   Periphery  Normal            IMAGING AND PROCEDURES  Imaging and Procedures for 05/07/21  OCT, Retina - OU - Both Eyes       Right Eye Quality was good. Scan locations included subfoveal. Central Foveal Thickness: 306. Progression has improved. Findings include abnormal foveal contour, intraretinal fluid, disciform scar.   Left Eye Quality was good. Scan locations included subfoveal. Central Foveal Thickness: 288. Progression has improved. Findings include abnormal foveal contour, intraretinal fluid, disciform scar.   Notes Improving macular condition in each eye post recent Avastin OD, and less  subretinal fluid.  Persistent intraretinal fluid over the lesion nasal to the fovea.  Subretinal fluid subfoveal as compared to March 2022 has resolved, follow-up OD as scheduled  OS, still active superonasal to the FAZ, repeat examination left eye and injection left eye today, for residual intraretinal fluid temporally     Intravitreal Injection, Pharmacologic Agent - OS - Left Eye       Time Out 05/07/2021. 9:00 AM. Confirmed correct patient, procedure, site, and patient consented.   Anesthesia Topical anesthesia was used. Anesthetic medications included Akten 3.5%.   Procedure Preparation included Tobramycin 0.3%, Ofloxacin , 10% betadine to eyelids, 5% betadine to ocular surface. A 30 gauge needle was used.   Injection: 2.5 mg bevacizumab 2.5 MG/0.1ML   Route: Intravitreal   NDC: 38466-599-35, Lot: [id number]   Post-op Post injection exam found visual acuity of at least counting fingers. The patient tolerated the procedure well. There were no complications. The patient received written and verbal post procedure care education. Post injection medications were not given.              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 Exudative age-related macular degeneration of left eye with active choroidal neovascularization (HCC)  H35.3221 OCT, Retina - OU - Both Eyes    Intravitreal Injection, Pharmacologic Agent - OS - Left Eye     bevacizumab (AVASTIN) SOSY 2.5 mg    2. Exudative age-related macular degeneration of right eye with active choroidal neovascularization (HCC)  H35.3211       1.  Bilateral wet ARMD.  Each eye under improved control and responsive to Avastin therapy since onset of disease March 2022.  2.  OS today at 5-week follow-up for lesion superonasal to the fovea with residual intraretinal fluid, disease activity.  Repeat intravitreal Avastin OS today and follow-up OS in 5 weeks  3.  Dilate OD next as scheduled  Ophthalmic Meds Ordered this visit:  Meds ordered this encounter  Medications   bevacizumab (AVASTIN) SOSY 2.5 mg       Return in about 5 weeks (around 06/11/2021) for dilate, OS, AVASTIN OCT,, and OD follow-up as scheduled.  There are no Patient Instructions on file for this visit.   Explained the diagnoses, plan, and follow up with the patient and they expressed understanding.  Patient expressed understanding of the importance of proper follow up care.   Alford Highland Temari Schooler M.D. Diseases & Surgery of the Retina and Vitreous [facility] 05/07/21     Abbreviations: M myopia (nearsighted); A astigmatism; H hyperopia (farsighted); P presbyopia; Mrx spectacle prescription;  CTL contact lenses; OD right eye; OS left eye; OU both eyes  XT exotropia; ET esotropia; PEK punctate epithelial keratitis; PEE punctate epithelial erosions; DES dry eye syndrome; MGD meibomian gland dysfunction; ATs artificial tears; PFAT's preservative free artificial tears; NSC nuclear sclerotic cataract; PSC posterior subcapsular cataract; ERM epi-retinal membrane; PVD posterior vitreous detachment; RD retinal detachment; DM diabetes mellitus; DR diabetic retinopathy; NPDR non-proliferative diabetic retinopathy; PDR proliferative diabetic retinopathy; CSME  clinically significant macular edema; DME diabetic macular edema; dbh dot blot hemorrhages; CWS cotton wool spot; POAG primary open angle glaucoma;  C/D cup-to-disc ratio; HVF humphrey visual field; GVF goldmann visual field; OCT optical coherence tomography; IOP intraocular pressure; BRVO Branch retinal vein occlusion; CRVO central retinal vein occlusion; CRAO central retinal artery occlusion; BRAO branch retinal artery occlusion; RT retinal tear; SB scleral buckle; PPV pars plana vitrectomy; VH Vitreous hemorrhage; PRP panretinal laser photocoagulation; IVK intravitreal kenalog; VMT vitreomacular traction; MH Macular hole;  NVD neovascularization of the disc; NVE neovascularization elsewhere; AREDS age related eye disease study; ARMD age related macular degeneration; POAG primary open angle glaucoma; EBMD epithelial/anterior basement membrane dystrophy; ACIOL anterior chamber intraocular lens; IOL intraocular lens; PCIOL posterior chamber intraocular lens; Phaco/IOL phacoemulsification with intraocular lens placement; PRK photorefractive keratectomy; LASIK laser assisted in situ keratomileusis; HTN hypertension; DM diabetes mellitus; COPD chronic obstructive pulmonary disease

## 2021-05-07 NOTE — Assessment & Plan Note (Signed)
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

## 2021-05-12 ENCOUNTER — Encounter (INDEPENDENT_AMBULATORY_CARE_PROVIDER_SITE_OTHER): Payer: Medicare Other | Admitting: Ophthalmology

## 2021-05-20 ENCOUNTER — Other Ambulatory Visit: Payer: Self-pay

## 2021-05-20 ENCOUNTER — Encounter (INDEPENDENT_AMBULATORY_CARE_PROVIDER_SITE_OTHER): Payer: Self-pay | Admitting: Ophthalmology

## 2021-05-20 ENCOUNTER — Ambulatory Visit (INDEPENDENT_AMBULATORY_CARE_PROVIDER_SITE_OTHER): Payer: Medicare Other | Admitting: Ophthalmology

## 2021-05-20 DIAGNOSIS — H353211 Exudative age-related macular degeneration, right eye, with active choroidal neovascularization: Secondary | ICD-10-CM | POA: Diagnosis not present

## 2021-05-20 DIAGNOSIS — H353221 Exudative age-related macular degeneration, left eye, with active choroidal neovascularization: Secondary | ICD-10-CM

## 2021-05-20 MED ORDER — BEVACIZUMAB 2.5 MG/0.1ML IZ SOSY
2.5000 mg | PREFILLED_SYRINGE | INTRAVITREAL | Status: AC | PRN
  Administered 2021-05-20: 2.5 mg via INTRAVITREAL

## 2021-05-20 NOTE — Assessment & Plan Note (Signed)
Follow-up today at 7-week interval OD, lesion nasal to the fovea with intraretinal fluid remaining yet much less subretinal fluid compared to onset from vascularized PED.  Repeat injection intravitreal Avastin OD today and follow-up next in 6 weeks

## 2021-05-20 NOTE — Patient Instructions (Signed)
Patient instructed notify the office promptly new onset visual acuity declines or distortion

## 2021-05-20 NOTE — Assessment & Plan Note (Signed)
OS, much improved superonasal to the fovea, continue to monitor and follow-up as scheduled

## 2021-05-20 NOTE — Progress Notes (Signed)
05/20/2021     CHIEF COMPLAINT Patient presents for Retina Follow Up (7 Wk F/U OD, poss Avastin OD//Pt sts VA OD is "bent" temporally. Pt sts, "other than that I think we are making an improvement." Pt c/o double VA OS off and on x 1 week.) History of wet ARMD OU.  Follow-up today currently OD at 7 weeks interval exam  HISTORY OF PRESENT ILLNESS: Victor Walker is a 65 y.o. male who presents to the clinic today for:   HPI     Retina Follow Up           Diagnosis: Wet AMD   Laterality: right eye   Onset: 7 weeks ago   Severity: mild   Duration: 7 weeks   Course: stable   Comments: 7 Wk F/U OD, poss Avastin OD  Pt sts VA OD is "bent" temporally. Pt sts, "other than that I think we are making an improvement." Pt c/o double VA OS off and on x 1 week.       Last edited by Gwendel Hanson, COA on 05/20/2021  8:20 AM.      Referring physician: [name], MD [address] [place],  [place] [address]  HISTORICAL INFORMATION:   Selected notes from the MEDICAL RECORD NUMBER       CURRENT MEDICATIONS: Current Outpatient Medications (Ophthalmic Drugs)  Medication Sig   ofloxacin (OCUFLOX) 0.3 % ophthalmic solution  (Patient not taking: No sig reported)   prednisoLONE acetate (PRED FORTE) 1 % ophthalmic suspension SMARTSIG:In Eye(s) (Patient not taking: No sig reported)   No current facility-administered medications for this visit. (Ophthalmic Drugs)   Current Outpatient Medications (Other)  Medication Sig   atorvastatin (LIPITOR) 20 MG tablet Take 1 tablet by mouth daily.   clopidogrel (PLAVIX) 75 MG tablet Take 1 tablet (75 mg total) by mouth daily. (Patient not taking: Reported on 01/22/2021)   metoprolol tartrate (LOPRESSOR) 25 MG tablet Take 1 tablet (25 mg total) by mouth 2 (two) times daily.   nitroGLYCERIN (NITROSTAT) 0.4 MG SL tablet Place 1 tablet (0.4 mg total) under the tongue every 5 (five) minutes as needed for chest pain.   traMADol  (ULTRAM) 50 MG tablet Take 1-2 tablets by mouth as needed.   No current facility-administered medications for this visit. (Other)      REVIEW OF SYSTEMS:    ALLERGIES Allergies  Allergen Reactions   Aspirin Anaphylaxis   Penicillins Anaphylaxis    Has patient had a PCN reaction causing immediate rash, facial/tongue/throat swelling, SOB or lightheadedness with hypotension:YES Has patient had a PCN reaction causing severe rash involving mucus membranes or skin necrosis: NO Has patient had a PCN reaction that required hospitalization NO Has patient had a PCN reaction occurring within the last 10 years:NO If all of the above answers are "NO", then may proceed with Cephalosporin use.   Shrimp [Shellfish Allergy] Anaphylaxis    PAST MEDICAL HISTORY Past Medical History:  Diagnosis Date   Corneal abrasion, right, initial encounter 02/20/2021   Patient awakened this morning with a foreign body sensation in his eye.   H/O bilateral inguinal hernia repair    TB (pulmonary tuberculosis)    Past Surgical History:  Procedure Laterality Date   CATARACT EXTRACTION Right 01/29/2021   Dr. [name]   CATARACT EXTRACTION Left 02/05/2021   Dr. [name]   HERNIA REPAIR     KIDNEY STONE SURGERY      FAMILY HISTORY Family History  Problem  Relation Age of Onset   Diabetes Mother    Hypertension Mother     SOCIAL HISTORY Social History   Tobacco Use   Smoking status: Every Day    Pack years: 0.00   Smokeless tobacco: Never  Vaping Use   Vaping Use: Never used  Substance Use Topics   Alcohol use: Yes   Drug use: Yes    Types: Marijuana         OPHTHALMIC EXAM:  Base Eye Exam     Visual Acuity (ETDRS)       Right Left   Dist [place] 20/25 20/30 -2   Dist ph [place]  20/20 -2         Tonometry (Tonopen, 8:21 AM)       Right Left   Pressure 12 10         Pupils       Pupils Dark Light Shape React APD   Right PERRL 4 3 Round Slow None   Left PERRL 4 3 Round Slow None          Visual Fields (Counting fingers)       Left Right    Full Full         Extraocular Movement       Right Left    Full Full         Neuro/Psych     Oriented x3: Yes   Mood/Affect: Normal         Dilation     Right eye: 1.0% Mydriacyl, 2.5% Phenylephrine @ 8:21 AM           Slit Lamp and Fundus Exam     External Exam       Right Left   External Normal Normal         Slit Lamp Exam       Right Left   Lids/Lashes Normal    Conjunctiva/Sclera White and quiet    Cornea Clear, no epi defects    Anterior Chamber Deep and quiet,,  no cells,     Iris Round and reactive    Lens Centered posterior chamber intraocular lens    Anterior Vitreous Normal          Fundus Exam       Right Left   Posterior Vitreous Posterior vitreous detachment    Disc Normal    C/D Ratio 0.2    Macula Less macular thickening, Hard drusen, Retinal pigment epithelial mottling, Retinal pigment epithelial detachment, Subretinal neovascular membrane much less active    Vessels Normal    Periphery Normal             IMAGING AND PROCEDURES  Imaging and Procedures for 05/20/21  OCT, Retina - OU - Both Eyes       Right Eye Quality was good. Scan locations included subfoveal. Central Foveal Thickness: 299. Progression has been stable. Findings include abnormal foveal contour, intraretinal fluid, disciform scar.   Left Eye Quality was good. Scan locations included subfoveal. Central Foveal Thickness: 288. Progression has improved. Findings include abnormal foveal contour, intraretinal fluid, disciform scar.   Notes Improving macular condition in each eye post recent Avastin OD, and less subretinal fluid.  Today at 7-week post last injection persistent intraretinal fluid over the lesion nasal to the fovea.  Subretinal fluid subfoveal as compared to March 2022 has resolved, and repeat injection today and follow-up next in 6 to 7 weeks OD  OS, still active  superonasal to the FAZ,  repeat examination left eye  for residual intraretinal fluid temporally     Intravitreal Injection, Pharmacologic Agent - OD - Right Eye       Time Out 05/20/2021. 9:01 AM. Confirmed correct patient, procedure, site, and patient consented.   Anesthesia Topical anesthesia was used. Anesthetic medications included Akten 3.5%.   Procedure Preparation included Ofloxacin , 5% betadine to ocular surface, 10% betadine to eyelids. A 30 gauge needle was used.   Injection: 2.5 mg bevacizumab 2.5 MG/0.1ML   Route: Intravitreal, Site: Right Eye   NDC: [phone], Lot: [id number]   Post-op Post injection exam found visual acuity of at least counting fingers. The patient tolerated the procedure well. There were no complications. The patient received written and verbal post procedure care education. Post injection medications were not given.              ASSESSMENT/PLAN:  Exudative age-related macular degeneration of right eye with active choroidal neovascularization (HCC) Follow-up today at 7-week interval OD, lesion nasal to the fovea with intraretinal fluid remaining yet much less subretinal fluid compared to onset from vascularized PED.  Repeat injection intravitreal Avastin OD today and follow-up next in 6 weeks  Exudative age-related macular degeneration of left eye with active choroidal neovascularization (HCC) OS, much improved superonasal to the fovea, continue to monitor and follow-up as scheduled     ICD-10-CM   1. Exudative age-related macular degeneration of right eye with active choroidal neovascularization (HCC)  H35.3211 OCT, Retina - OU - Both Eyes    Intravitreal Injection, Pharmacologic Agent - OD - Right Eye    bevacizumab (AVASTIN) SOSY 2.5 mg    2. Exudative age-related macular degeneration of left eye with active choroidal neovascularization (HCC)  H35.3221       1.  OD vastly improved compared to onset of lesion, March 2022.  Nonetheless  still active lesion.  2.  Repeat injection intravitreal Avastin OD today and examination next in 6 weeks OD  3.  Dilate OS next as scheduled  Ophthalmic Meds Ordered this visit:  Meds ordered this encounter  Medications   bevacizumab (AVASTIN) SOSY 2.5 mg       Return for RV 6 to 7 weeks, dilate, OD, AVASTIN OCT,, OS follow-up as scheduled.  There are no Patient Instructions on file for this visit.   Explained the diagnoses, plan, and follow up with the patient and they expressed understanding.  Patient expressed understanding of the importance of proper follow up care.   Alford Highland Draken Farrior M.D. Diseases & Surgery of the Retina and Vitreous [facility] 05/20/21     Abbreviations: M myopia (nearsighted); A astigmatism; H hyperopia (farsighted); P presbyopia; Mrx spectacle prescription;  CTL contact lenses; OD right eye; OS left eye; OU both eyes  XT exotropia; ET esotropia; PEK punctate epithelial keratitis; PEE punctate epithelial erosions; DES dry eye syndrome; MGD meibomian gland dysfunction; ATs artificial tears; PFAT's preservative free artificial tears; NSC nuclear sclerotic cataract; PSC posterior subcapsular cataract; ERM epi-retinal membrane; PVD posterior vitreous detachment; RD retinal detachment; DM diabetes mellitus; DR diabetic retinopathy; NPDR non-proliferative diabetic retinopathy; PDR proliferative diabetic retinopathy; CSME clinically significant macular edema; DME diabetic macular edema; dbh dot blot hemorrhages; CWS cotton wool spot; POAG primary open angle glaucoma; C/D cup-to-disc ratio; HVF humphrey visual field; GVF goldmann visual field; OCT optical coherence tomography; IOP intraocular pressure; BRVO Branch retinal vein occlusion; CRVO central retinal vein occlusion; CRAO central retinal artery occlusion; BRAO branch retinal artery occlusion; RT retinal tear;  SB scleral buckle; PPV pars plana vitrectomy; VH Vitreous hemorrhage; PRP panretinal laser  photocoagulation; IVK intravitreal kenalog; VMT vitreomacular traction; MH Macular hole;  NVD neovascularization of the disc; NVE neovascularization elsewhere; AREDS age related eye disease study; ARMD age related macular degeneration; POAG primary open angle glaucoma; EBMD epithelial/anterior basement membrane dystrophy; ACIOL anterior chamber intraocular lens; IOL intraocular lens; PCIOL posterior chamber intraocular lens; Phaco/IOL phacoemulsification with intraocular lens placement; PRK photorefractive keratectomy; LASIK laser assisted in situ keratomileusis; HTN hypertension; DM diabetes mellitus; COPD chronic obstructive pulmonary disease

## 2021-05-22 ENCOUNTER — Telehealth: Payer: Self-pay

## 2021-05-22 DIAGNOSIS — Z006 Encounter for examination for normal comparison and control in clinical research program: Secondary | ICD-10-CM

## 2021-05-22 NOTE — Telephone Encounter (Signed)
I called patient for his 90-day Identify Study follow up phone call. Patient is doing well at this time. Pt did state he still gets SOB at times with exertion. Pt stated he followed up with his Cardiologist for these symptoms. I reminded patient I would call him in March for his 1 year follow-up.

## 2021-06-02 ENCOUNTER — Other Ambulatory Visit: Payer: Self-pay

## 2021-06-02 ENCOUNTER — Encounter (HOSPITAL_COMMUNITY): Payer: Self-pay

## 2021-06-02 ENCOUNTER — Ambulatory Visit (HOSPITAL_COMMUNITY)
Admission: EM | Admit: 2021-06-02 | Discharge: 2021-06-02 | Disposition: A | Discharge status: Home / Self Care | Payer: Medicare Other | Attending: Emergency Medicine | Admitting: Emergency Medicine

## 2021-06-02 DIAGNOSIS — R21 Rash and other nonspecific skin eruption: Secondary | ICD-10-CM

## 2021-06-02 MED ORDER — TERBINAFINE HCL 250 MG PO TABS: 250.0000 mg | ORAL_TABLET | Freq: Every day | ORAL | 0 refills | Status: AC

## 2021-06-02 NOTE — Discharge Instructions (Addendum)
Take Lamisil daily for 90 days  Follow up with dermatologist within the next 2-3 weeks for reevaluation   Continue use of topical creams prescribed to help with rash

## 2021-06-02 NOTE — ED Triage Notes (Signed)
Pt in with c/o generalized rash that started Sunday on his legs and torso  States he has had similar rash before and was seeing a dermatologist

## 2021-06-02 NOTE — ED Provider Notes (Signed)
[facility]    CSN: [id number] Arrival date & time: 06/02/21  4098      History   Chief Complaint Chief Complaint  Patient presents with   Rash    HPI Victor Walker is a 65 y.o. male.   Patient presents with rash that began on his feet two days go that has spread to bilateral legs, groin, abdomen and back. Rash is pruritic and when scratched leaks blood and clear drainage. Does not recall contributing factor but was fishing on a bank the day prior and recalls possibly being bit by a bug on his back. Had a similar rash one year prior, being treated by dermatology with creams, per patient rash cleared up after several weeks of oral antifungal medication. Has attempted use of creams left over from last bout and otc benadryl with no relief.   Past Medical History:  Diagnosis Date   Corneal abrasion, right, initial encounter 02/20/2021   Patient awakened this morning with a foreign body sensation in his eye.   H/O bilateral inguinal hernia repair    TB (pulmonary tuberculosis)     Patient Active Problem List   Diagnosis Date Noted   Exudative age-related macular degeneration of right eye with active choroidal neovascularization (HCC) 02/12/2021   Exudative age-related macular degeneration of left eye with active choroidal neovascularization (HCC) 02/12/2021   Pseudophakia of both eyes 02/12/2021   Exertional chest pain 01/02/2021   Stable angina pectoris (HCC) 01/02/2021   Mixed hyperlipidemia 01/02/2021    Past Surgical History:  Procedure Laterality Date   CATARACT EXTRACTION Right 01/29/2021   Dr. [name]   CATARACT EXTRACTION Left 02/05/2021   Dr. [name]   HERNIA REPAIR     KIDNEY STONE SURGERY         Home Medications    Prior to Admission medications   Medication Sig Start Date End Date Taking? Authorizing Provider  terbinafine (LAMISIL) 250 MG tablet Take 1 tablet (250 mg total) by mouth daily. 06/02/21  Yes Tiwanda Threats R, NP   atorvastatin (LIPITOR) 20 MG tablet Take 1 tablet by mouth daily. 12/10/20   [provider]  clopidogrel (PLAVIX) 75 MG tablet Take 1 tablet (75 mg total) by mouth daily. Patient not taking: Reported on 01/22/2021 01/02/21   Elder Negus, MD  metoprolol tartrate (LOPRESSOR) 25 MG tablet Take 1 tablet (25 mg total) by mouth 2 (two) times daily. 01/02/21 04/02/21  Patwardhan, Anabel Bene, MD  nitroGLYCERIN (NITROSTAT) 0.4 MG SL tablet Place 1 tablet (0.4 mg total) under the tongue every 5 (five) minutes as needed for chest pain. 01/02/21 04/02/21  Patwardhan, Anabel Bene, MD  ofloxacin (OCUFLOX) 0.3 % ophthalmic solution  02/12/21   [provider]  prednisoLONE acetate (PRED FORTE) 1 % ophthalmic suspension SMARTSIG:In Eye(s) Patient not taking: No sig reported 02/12/21   [provider]  traMADol (ULTRAM) 50 MG tablet Take 1-2 tablets by mouth as needed. 12/06/20   [provider]    Family History Family History  Problem Relation Age of Onset   Diabetes Mother    Hypertension Mother     Social History Social History   Tobacco Use   Smoking status: Every Day    Pack years: 0.00   Smokeless tobacco: Never  Vaping Use   Vaping Use: Never used  Substance Use Topics   Alcohol use: Yes   Drug use: Yes    Types: Marijuana     Allergies   Aspirin, Penicillins,  and Shrimp [shellfish allergy]   Review of Systems Review of Systems  Constitutional: Negative.   Respiratory: Negative.    Cardiovascular: Negative.   Skin:  Positive for rash.  Neurological: Negative.     Physical Exam Triage Vital Signs ED Triage Vitals  Enc Vitals Group     BP 06/02/21 1050 (!) 154/108     Pulse Rate 06/02/21 1050 93     Resp 06/02/21 1050 18     Temp 06/02/21 1053 98.7 F (37.1 C)     Temp Source 06/02/21 1050 Oral     SpO2 06/02/21 1050 99 %     Weight --      Height --      Head Circumference --      Peak Flow --      Pain Score 06/02/21 1048 0      Pain Loc --      Pain Edu? --      Excl. in GC? --    No data found.  Updated Vital Signs BP 131/71   Pulse 93   Temp 98.7 F (37.1 C) (Oral)   Resp 18   SpO2 99%   Visual Acuity Right Eye Distance:   Left Eye Distance:   Bilateral Distance:    Right Eye Near:   Left Eye Near:    Bilateral Near:     Physical Exam Constitutional:      Appearance: Normal appearance. He is normal weight.  Eyes:     Extraocular Movements: Extraocular movements intact.  Pulmonary:     Effort: Pulmonary effort is normal.  Skin:    Comments: Defer to photo, picture is of right leg, rash is present on bilateral legs, groin, bilateral arm, abdomen and back, becomes more widespread as it moves into upper body   Neurological:     Mental Status: He is alert and oriented to person, place, and time. Mental status is at baseline.  Psychiatric:        Mood and Affect: Mood normal.        Behavior: Behavior normal.     UC Treatments / Results  Labs (all labs ordered are listed, but only abnormal results are displayed) Labs Reviewed - No data to display  EKG   Radiology No results found.  Procedures Procedures (including critical care time)  Medications Ordered in UC Medications - No data to display  Initial Impression / Assessment and Plan / UC Course  I have reviewed the triage vital signs and the nursing notes.  Pertinent labs & imaging results that were available during my care of the patient were reviewed by me and considered in my medical decision making (see chart for details).  Rash  Terbinafine 250 mg daily for 90 days, medication used prior Advised dermatology follow within 2-3 weeks for evaluation 3. Advised continued use of prescribed creams  4. Advised otc antihistamines for itching  Final Clinical Impressions(s) / UC Diagnoses   Final diagnoses:  Rash     Discharge Instructions      Take Lamisil daily for 90 days  Follow up with dermatologist within the next  2-3 weeks for reevaluation   Continue use of topical creams prescribed to help with rash      ED Prescriptions     Medication Sig Dispense Auth. Provider   terbinafine (LAMISIL) 250 MG tablet Take 1 tablet (250 mg total) by mouth daily. 90 tablet Malahki Gasaway, Elita Boone, NP      PDMP not reviewed this  encounter.   Valinda Hoar, NP 06/02/21 1151

## 2021-06-11 ENCOUNTER — Ambulatory Visit (INDEPENDENT_AMBULATORY_CARE_PROVIDER_SITE_OTHER): Payer: Medicare Other | Admitting: Ophthalmology

## 2021-06-11 ENCOUNTER — Other Ambulatory Visit: Payer: Self-pay

## 2021-06-11 DIAGNOSIS — H353221 Exudative age-related macular degeneration, left eye, with active choroidal neovascularization: Secondary | ICD-10-CM

## 2021-06-11 DIAGNOSIS — H353211 Exudative age-related macular degeneration, right eye, with active choroidal neovascularization: Secondary | ICD-10-CM | POA: Diagnosis not present

## 2021-06-11 MED ORDER — BEVACIZUMAB 2.5 MG/0.1ML IZ SOSY
2.5000 mg | PREFILLED_SYRINGE | INTRAVITREAL | Status: AC | PRN
  Administered 2021-06-11: 2.5 mg via INTRAVITREAL

## 2021-06-11 NOTE — Assessment & Plan Note (Signed)
OS today at 5 weeks follow-up post injection Avastin with stable acuity.  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

## 2021-06-11 NOTE — Assessment & Plan Note (Signed)
OD, follow-up as scheduled exam

## 2021-06-11 NOTE — Progress Notes (Signed)
06/11/2021     CHIEF COMPLAINT Patient presents for Macular Degeneration (Patient has ongoing distortion or double vision monocular basis.//Upon testing with Snellen eye chart today, found likely to have refractive error triggering this.) and Retina Follow Up (5 Wk F/U OS, poss Avastin OS//Pt sts VA OD is still curved or warped. VA OS stable. Pt sts most of his floaters are gone OU. Pt sts floaters OS disappeared 6 week ago.)   HISTORY OF PRESENT ILLNESS: Victor Walker is a 65 y.o. male who presents to the clinic today for:   HPI     Macular Degeneration           Comments: Patient has ongoing distortion or double vision monocular basis.  Upon testing with Snellen eye chart today, found likely to have refractive error triggering this.         Retina Follow Up           Diagnosis: Wet AMD   Laterality: left eye   Onset: 5 weeks ago   Severity: mild   Duration: 5 weeks   Course: stable   Comments: 5 Wk F/U OS, poss Avastin OS  Pt sts VA OD is still curved or warped. VA OS stable. Pt sts most of his floaters are gone OU. Pt sts floaters OS disappeared 6 week ago.       Last edited by Edmon Crapeankin, Breyona Swander A, MD on 06/11/2021  9:04 AM.      Referring physician: Verlon AuBoyd, Tammy Lamonica, MD [address] [place] [id number]  HISTORICAL INFORMATION:   Selected notes from the MEDICAL RECORD NUMBER       CURRENT MEDICATIONS: Current Outpatient Medications (Ophthalmic Drugs)  Medication Sig   ofloxacin (OCUFLOX) 0.3 % ophthalmic solution  (Patient not taking: No sig reported)   prednisoLONE acetate (PRED FORTE) 1 % ophthalmic suspension SMARTSIG:In Eye(s) (Patient not taking: No sig reported)   No current facility-administered medications for this visit. (Ophthalmic Drugs)   Current Outpatient Medications (Other)  Medication Sig   atorvastatin (LIPITOR) 20 MG tablet Take 1 tablet by mouth daily.   clopidogrel (PLAVIX) 75 MG tablet Take 1 tablet (75  mg total) by mouth daily. (Patient not taking: Reported on 01/22/2021)   metoprolol tartrate (LOPRESSOR) 25 MG tablet Take 1 tablet (25 mg total) by mouth 2 (two) times daily.   nitroGLYCERIN (NITROSTAT) 0.4 MG SL tablet Place 1 tablet (0.4 mg total) under the tongue every 5 (five) minutes as needed for chest pain.   terbinafine (LAMISIL) 250 MG tablet Take 1 tablet (250 mg total) by mouth daily.   traMADol (ULTRAM) 50 MG tablet Take 1-2 tablets by mouth as needed.   No current facility-administered medications for this visit. (Other)      REVIEW OF SYSTEMS:    ALLERGIES Allergies  Allergen Reactions   Aspirin Anaphylaxis   Penicillins Anaphylaxis    Has patient had a PCN reaction causing immediate rash, facial/tongue/throat swelling, SOB or lightheadedness with hypotension:YES Has patient had a PCN reaction causing severe rash involving mucus membranes or skin necrosis: NO Has patient had a PCN reaction that required hospitalization NO Has patient had a PCN reaction occurring within the last 10 years:NO If all of the above answers are "NO", then may proceed with Cephalosporin use.   Shrimp [Shellfish Allergy] Anaphylaxis    PAST MEDICAL HISTORY Past Medical History:  Diagnosis Date   Corneal abrasion, right, initial encounter 02/20/2021   Patient awakened this morning  with a foreign body sensation in his eye.   H/O bilateral inguinal hernia repair    TB (pulmonary tuberculosis)    Past Surgical History:  Procedure Laterality Date   CATARACT EXTRACTION Right 01/29/2021   Dr. [name]   CATARACT EXTRACTION Left 02/05/2021   Dr. [name]   HERNIA REPAIR     KIDNEY STONE SURGERY      FAMILY HISTORY Family History  Problem Relation Age of Onset   Diabetes Mother    Hypertension Mother     SOCIAL HISTORY Social History   Tobacco Use   Smoking status: Every Day   Smokeless tobacco: Never  Vaping Use   Vaping Use: Never used  Substance Use Topics   Alcohol use: Yes    Drug use: Yes    Types: Marijuana         OPHTHALMIC EXAM:  Base Eye Exam     Visual Acuity (ETDRS)       Right Left   Dist [place] 20/40 20/40   Dist ph [place] 20/30 -1 20/20         Tonometry (Tonopen, 8:35 AM)       Right Left   Pressure 10 8         Pupils       Pupils APD   Right PERRL None   Left PERRL None         Visual Fields       Left Right    Full Full         Extraocular Movement       Right Left    Full, Ortho Full, Ortho         Neuro/Psych     Oriented x3: Yes   Mood/Affect: Normal         Dilation     Left eye: 1.0% Mydriacyl, 2.5% Phenylephrine @ 8:36 AM           Slit Lamp and Fundus Exam     External Exam       Right Left   External Normal Normal         Slit Lamp Exam       Right Left   Lids/Lashes Normal Normal   Conjunctiva/Sclera White and quiet White and quiet   Cornea Clear, no epi defects Clear   Anterior Chamber Deep and quiet,,  no cells,  Deep and quiet   Iris Round and reactive Round and reactive   Lens Centered posterior chamber intraocular lens Centered posterior chamber intraocular lens   Anterior Vitreous Normal Normal         Fundus Exam       Right Left   Posterior Vitreous  Posterior vitreous detachment   Disc  Normal   C/D Ratio  0.2   Macula  Pigmented nodule superonasal to the macula foveal region left eye, smaller ring of atrophy no subretinal fluid   Vessels  Normal   Periphery  Normal            IMAGING AND PROCEDURES  Imaging and Procedures for 06/11/21  OCT, Retina - OU - Both Eyes       Right Eye Quality was good. Scan locations included subfoveal. Central Foveal Thickness: 264. Progression has improved. Findings include abnormal foveal contour, intraretinal fluid, disciform scar.   Left Eye Quality was good. Scan locations included subfoveal. Central Foveal Thickness: 289. Progression has been stable. Findings include abnormal foveal contour, intraretinal  fluid, disciform scar.   Notes Improving  macular condition in each eye post recent Avastin OD, and less subretinal fluid.  Today at 3-week post last injection persistent intraretinal fluid over the lesion nasal to the fovea.  Subretinal fluid subfoveal as compared to March 2022 has resolved,   OS, still active superonasal to the FAZ, repeat examination left eye  for residual intraretinal fluid temporally, currently at 5-week interval stable.  Repeat injection today and examination in 5 to 6 weeks left eye     Intravitreal Injection, Pharmacologic Agent - OS - Left Eye       Time Out 06/11/2021. 9:06 AM. Confirmed correct patient, procedure, site, and patient consented.   Anesthesia Topical anesthesia was used. Anesthetic medications included Akten 3.5%.   Procedure Preparation included Tobramycin 0.3%, Ofloxacin , 10% betadine to eyelids, 5% betadine to ocular surface. A 30 gauge needle was used.   Injection: 2.5 mg bevacizumab 2.5 MG/0.1ML   Route: Intravitreal, Site: Left Eye   NDC: [phone]   Post-op Post injection exam found visual acuity of at least counting fingers. The patient tolerated the procedure well. There were no complications. The patient received written and verbal post procedure care education. Post injection medications were not given.              ASSESSMENT/PLAN:  Exudative age-related macular degeneration of left eye with active choroidal neovascularization (HCC) OS today at 5 weeks follow-up post injection Avastin with stable acuity.  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 Exudative age-related macular degeneration of left eye with active choroidal neovascularization (HCC)  H35.3221 OCT,  Retina - OU - Both Eyes    Intravitreal Injection, Pharmacologic Agent - OS - Left Eye    bevacizumab (AVASTIN) SOSY 2.5 mg    2. Exudative age-related macular degeneration of right eye with active choroidal neovascularization (HCC)  H35.3211       1.  ARMD with CNVM OU.  OU overall improved from commencement of therapy  2.  OD follow-up as scheduled  3.  OS, currently at 5-week follow-up with lesion located superotemporal to the optic nerve and superonasal to the foveal avascular zone, controlled yet still active with intraretinal fluid today at 5-week interval post Avastin repeat injection today and examination next in 5 to 6 weeks  Ophthalmic Meds Ordered this visit:  Meds ordered this encounter  Medications   bevacizumab (AVASTIN) SOSY 2.5 mg       Return in about 6 weeks (around 07/23/2021) for dilate, OS, AVASTIN OCT,,,, and follow-up OD as scheduled Avastin OCT.  There are no Patient Instructions on file for this visit.   Explained the diagnoses, plan, and follow up with the patient and they expressed understanding.  Patient expressed understanding of the importance of proper follow up care.   Alford Highland Niomi Valent M.D. Diseases & Surgery of the Retina and Vitreous [facility] 06/11/21     Abbreviations: M myopia (nearsighted); A astigmatism; H hyperopia (farsighted); P presbyopia; Mrx spectacle prescription;  CTL contact lenses; OD right eye; OS left eye; OU both eyes  XT exotropia; ET esotropia; PEK punctate epithelial keratitis; PEE punctate epithelial erosions; DES dry eye syndrome; MGD meibomian gland dysfunction; ATs artificial tears; PFAT's preservative free artificial tears; NSC nuclear sclerotic cataract; PSC posterior subcapsular cataract; ERM epi-retinal  membrane; PVD posterior vitreous detachment; RD retinal detachment; DM diabetes mellitus; DR diabetic retinopathy; NPDR non-proliferative diabetic retinopathy; PDR proliferative diabetic retinopathy;  CSME clinically significant macular edema; DME diabetic macular edema; dbh dot blot hemorrhages; CWS cotton wool spot; POAG primary open angle glaucoma; C/D cup-to-disc ratio; HVF humphrey visual field; GVF goldmann visual field; OCT optical coherence tomography; IOP intraocular pressure; BRVO Branch retinal vein occlusion; CRVO central retinal vein occlusion; CRAO central retinal artery occlusion; BRAO branch retinal artery occlusion; RT retinal tear; SB scleral buckle; PPV pars plana vitrectomy; VH Vitreous hemorrhage; PRP panretinal laser photocoagulation; IVK intravitreal kenalog; VMT vitreomacular traction; MH Macular hole;  NVD neovascularization of the disc; NVE neovascularization elsewhere; AREDS age related eye disease study; ARMD age related macular degeneration; POAG primary open angle glaucoma; EBMD epithelial/anterior basement membrane dystrophy; ACIOL anterior chamber intraocular lens; IOL intraocular lens; PCIOL posterior chamber intraocular lens; Phaco/IOL phacoemulsification with intraocular lens placement; PRK photorefractive keratectomy; LASIK laser assisted in situ keratomileusis; HTN hypertension; DM diabetes mellitus; COPD chronic obstructive pulmonary disease

## 2021-07-08 ENCOUNTER — Ambulatory Visit (INDEPENDENT_AMBULATORY_CARE_PROVIDER_SITE_OTHER): Payer: Medicare Other | Admitting: Ophthalmology

## 2021-07-08 ENCOUNTER — Encounter (INDEPENDENT_AMBULATORY_CARE_PROVIDER_SITE_OTHER): Payer: Self-pay | Admitting: Ophthalmology

## 2021-07-08 ENCOUNTER — Other Ambulatory Visit: Payer: Self-pay

## 2021-07-08 DIAGNOSIS — H353221 Exudative age-related macular degeneration, left eye, with active choroidal neovascularization: Secondary | ICD-10-CM

## 2021-07-08 DIAGNOSIS — H353211 Exudative age-related macular degeneration, right eye, with active choroidal neovascularization: Secondary | ICD-10-CM

## 2021-07-08 MED ORDER — BEVACIZUMAB 2.5 MG/0.1ML IZ SOSY
2.5000 mg | PREFILLED_SYRINGE | INTRAVITREAL | Status: AC | PRN
  Administered 2021-07-08: 2.5 mg via INTRAVITREAL

## 2021-07-08 NOTE — Assessment & Plan Note (Signed)
Improving acuity per patient currently at 4-week follow-up.,  Follow-up as scheduled

## 2021-07-08 NOTE — Assessment & Plan Note (Signed)
Improved OD with much improved macular anatomy and improved symptoms.  At 7-week follow-up today post Avastin.  Repeat injection today and maintain 6 to 7-week follow-up OD

## 2021-07-08 NOTE — Progress Notes (Signed)
07/08/2021     CHIEF COMPLAINT Patient presents for Retina Follow Up (5 Wk F/U OS, poss Avastin OS//Pt sts VA OD is still curved or warped. VA OS stable. Pt sts most of his floaters are gone OU. Pt sts floaters OS disappeared 6 week ago.)   HISTORY OF PRESENT ILLNESS: Victor Walker is a 65 y.o. male who presents to the clinic today for:   HPI     Retina Follow Up           Diagnosis: Wet AMD   Laterality: right eye   Onset: 7 weeks ago   Severity: mild   Duration: 7 weeks   Course: stable   Comments: 5 Wk F/U OS, poss Avastin OS  Pt sts VA OD is still curved or warped. VA OS stable. Pt sts most of his floaters are gone OU. Pt sts floaters OS disappeared 6 week ago.         Comments   7 week fu od oct avastin od Pt states vision is stable, "floaters are going away, right eye vision is still curved." But less so Denies FOL.       Last edited by Edmon Crape, MD on 07/08/2021  8:34 AM.      Referring physician: [name], MD [address] [place],  [place] [address]  HISTORICAL INFORMATION:   Selected notes from the MEDICAL RECORD NUMBER       CURRENT MEDICATIONS: Current Outpatient Medications (Ophthalmic Drugs)  Medication Sig   ofloxacin (OCUFLOX) 0.3 % ophthalmic solution  (Patient not taking: No sig reported)   prednisoLONE acetate (PRED FORTE) 1 % ophthalmic suspension SMARTSIG:In Eye(s) (Patient not taking: No sig reported)   No current facility-administered medications for this visit. (Ophthalmic Drugs)   Current Outpatient Medications (Other)  Medication Sig   atorvastatin (LIPITOR) 20 MG tablet Take 1 tablet by mouth daily.   clopidogrel (PLAVIX) 75 MG tablet Take 1 tablet (75 mg total) by mouth daily. (Patient not taking: Reported on 01/22/2021)   metoprolol tartrate (LOPRESSOR) 25 MG tablet Take 1 tablet (25 mg total) by mouth 2 (two) times daily.   nitroGLYCERIN (NITROSTAT) 0.4 MG SL tablet Place 1 tablet (0.4 mg  total) under the tongue every 5 (five) minutes as needed for chest pain.   terbinafine (LAMISIL) 250 MG tablet Take 1 tablet (250 mg total) by mouth daily.   traMADol (ULTRAM) 50 MG tablet Take 1-2 tablets by mouth as needed.   No current facility-administered medications for this visit. (Other)      REVIEW OF SYSTEMS:    ALLERGIES Allergies  Allergen Reactions   Aspirin Anaphylaxis   Penicillins Anaphylaxis    Has patient had a PCN reaction causing immediate rash, facial/tongue/throat swelling, SOB or lightheadedness with hypotension:YES Has patient had a PCN reaction causing severe rash involving mucus membranes or skin necrosis: NO Has patient had a PCN reaction that required hospitalization NO Has patient had a PCN reaction occurring within the last 10 years:NO If all of the above answers are "NO", then may proceed with Cephalosporin use.   Shrimp [Shellfish Allergy] Anaphylaxis    PAST MEDICAL HISTORY Past Medical History:  Diagnosis Date   Corneal abrasion, right, initial encounter 02/20/2021   Patient awakened this morning with a foreign body sensation in his eye.   H/O bilateral inguinal hernia repair    TB (pulmonary tuberculosis)    Past Surgical History:  Procedure Laterality Date   CATARACT  EXTRACTION Right 01/29/2021   Dr. Nile RiggsShapiro   CATARACT EXTRACTION Left 02/05/2021   Dr. Nile RiggsShapiro   HERNIA REPAIR     KIDNEY STONE SURGERY      FAMILY HISTORY Family History  Problem Relation Age of Onset   Diabetes Mother    Hypertension Mother     SOCIAL HISTORY Social History   Tobacco Use   Smoking status: Every Day   Smokeless tobacco: Never  Vaping Use   Vaping Use: Never used  Substance Use Topics   Alcohol use: Yes   Drug use: Yes    Types: Marijuana         OPHTHALMIC EXAM:  Base Eye Exam     Visual Acuity (ETDRS)       Right Left   Dist [place] 20/40 20/50 -1   Dist ph [place] 20/30 20/20 -2  Pt states OD chart looks curved, OS double vision when  reading chart        Tonometry (Tonopen, 8:15 AM)       Right Left   Pressure 15 12         Pupils       Pupils Dark Light Shape React APD   Right PERRL 4 3 Round Slow None   Left PERRL 4 3 Round Slow None         Visual Fields (Counting fingers)       Left Right    Full Full         Extraocular Movement       Right Left    Full Full         Neuro/Psych     Oriented x3: Yes   Mood/Affect: Normal         Dilation     Right eye: 1.0% Mydriacyl, 2.5% Phenylephrine @ 8:17 AM           Slit Lamp and Fundus Exam     External Exam       Right Left   External Normal Normal         Slit Lamp Exam       Right Left   Lids/Lashes Normal    Conjunctiva/Sclera White and quiet    Cornea Clear, no epi defects    Anterior Chamber Deep and quiet,,  no cells,     Iris Round and reactive    Lens Centered posterior chamber intraocular lens    Anterior Vitreous Normal          Fundus Exam       Right Left   Posterior Vitreous Posterior vitreous detachment    Disc Normal    C/D Ratio 0.2    Macula Less macular thickening, Hard drusen, Retinal pigment epithelial mottling, Retinal pigment epithelial detachment, Subretinal neovascular membrane much less active    Vessels Normal    Periphery Normal             IMAGING AND PROCEDURES  Imaging and Procedures for 07/08/21  OCT, Retina - OU - Both Eyes       Right Eye Quality was good. Scan locations included subfoveal. Central Foveal Thickness: 270. Progression has improved. Findings include abnormal foveal contour, intraretinal fluid, disciform scar.   Left Eye Quality was good. Scan locations included subfoveal. Central Foveal Thickness: 288. Progression has been stable. Findings include abnormal foveal contour, intraretinal fluid, disciform scar.   Notes Improving macular condition in each eye post recent Avastin OD, and less subretinal fluid.  Today at 7-week post  last injection  persistent intraretinal fluid over the lesion nasal to the fovea.  Subretinal fluid subfoveal as compared to March 2022 has resolved, watch region nasal to FAZ no overall much improved  OS, still active superonasal to the FAZ, repeat examination left eye  for residual intraretinal fluid temporally, currently at 4-week interval stable.  Follow-up as scheduled OS     Intravitreal Injection, Pharmacologic Agent - OD - Right Eye       Time Out 07/08/2021. 8:37 AM. Confirmed correct patient, procedure, site, and patient consented.   Anesthesia Topical anesthesia was used. Anesthetic medications included Akten 3.5%.   Procedure Preparation included 5% betadine to ocular surface, 10% betadine to eyelids, Tobramycin 0.3%. A 30 gauge needle was used.   Injection: 2.5 mg bevacizumab 2.5 MG/0.1ML   Route: Intravitreal, Site: Right Eye   NDC: [id number], Lot: [id number]   Post-op Post injection exam found visual acuity of at least counting fingers. The patient tolerated the procedure well. There were no complications. The patient received written and verbal post procedure care education. Post injection medications were not given.              ASSESSMENT/PLAN:  Exudative age-related macular degeneration of left eye with active choroidal neovascularization (HCC) Improving acuity per patient currently at 4-week follow-up.,  Follow-up as scheduled  Exudative age-related macular degeneration of right eye with active choroidal neovascularization (HCC) Improved OD with much improved macular anatomy and improved symptoms.  At 7-week follow-up today post Avastin.  Repeat injection today and maintain 6 to 7-week follow-up OD     ICD-10-CM   1. Exudative age-related macular degeneration of right eye with active choroidal neovascularization (HCC)  H35.3211 OCT, Retina - OU - Both Eyes    Intravitreal Injection, Pharmacologic Agent - OD - Right Eye    bevacizumab (AVASTIN) SOSY 2.5 mg    2.  Exudative age-related macular degeneration of left eye with active choroidal neovascularization (HCC)  H35.3221       Bilateral exudative ARMD.  Improving OU stable acuity.  Much less symptoms now OD currently at 7-week follow-up OD.  Repeat injection of vitreal Avastin OD today  2.  Follow-up OS next as scheduled  3.  Ophthalmic Meds Ordered this visit:  Meds ordered this encounter  Medications   bevacizumab (AVASTIN) SOSY 2.5 mg       Return in about 7 weeks (around 08/26/2021) for dilate, OD, AVASTIN OCT, latest appointment in the day,,, and OS as scheduled.  There are no Patient Instructions on file for this visit.   Explained the diagnoses, plan, and follow up with the patient and they expressed understanding.  Patient expressed understanding of the importance of proper follow up care.   Alford Highland Haly Feher M.D. Diseases & Surgery of the Retina and Vitreous [facility] 07/08/21     Abbreviations: M myopia (nearsighted); A astigmatism; H hyperopia (farsighted); P presbyopia; Mrx spectacle prescription;  CTL contact lenses; OD right eye; OS left eye; OU both eyes  XT exotropia; ET esotropia; PEK punctate epithelial keratitis; PEE punctate epithelial erosions; DES dry eye syndrome; MGD meibomian gland dysfunction; ATs artificial tears; PFAT's preservative free artificial tears; NSC nuclear sclerotic cataract; PSC posterior subcapsular cataract; ERM epi-retinal membrane; PVD posterior vitreous detachment; RD retinal detachment; DM diabetes mellitus; DR diabetic retinopathy; NPDR non-proliferative diabetic retinopathy; PDR proliferative diabetic retinopathy; CSME clinically significant macular edema; DME diabetic macular edema; dbh dot blot hemorrhages; CWS cotton wool spot; POAG primary open angle glaucoma; C/D  cup-to-disc ratio; HVF humphrey visual field; GVF goldmann visual field; OCT optical coherence tomography; IOP intraocular pressure; BRVO Branch retinal vein  occlusion; CRVO central retinal vein occlusion; CRAO central retinal artery occlusion; BRAO branch retinal artery occlusion; RT retinal tear; SB scleral buckle; PPV pars plana vitrectomy; VH Vitreous hemorrhage; PRP panretinal laser photocoagulation; IVK intravitreal kenalog; VMT vitreomacular traction; MH Macular hole;  NVD neovascularization of the disc; NVE neovascularization elsewhere; AREDS age related eye disease study; ARMD age related macular degeneration; POAG primary open angle glaucoma; EBMD epithelial/anterior basement membrane dystrophy; ACIOL anterior chamber intraocular lens; IOL intraocular lens; PCIOL posterior chamber intraocular lens; Phaco/IOL phacoemulsification with intraocular lens placement; PRK photorefractive keratectomy; LASIK laser assisted in situ keratomileusis; HTN hypertension; DM diabetes mellitus; COPD chronic obstructive pulmonary disease

## 2021-07-09 ENCOUNTER — Encounter (INDEPENDENT_AMBULATORY_CARE_PROVIDER_SITE_OTHER): Payer: Medicare Other | Admitting: Ophthalmology

## 2021-07-22 ENCOUNTER — Encounter (INDEPENDENT_AMBULATORY_CARE_PROVIDER_SITE_OTHER): Payer: Self-pay | Admitting: Ophthalmology

## 2021-07-22 ENCOUNTER — Other Ambulatory Visit: Payer: Self-pay

## 2021-07-22 ENCOUNTER — Ambulatory Visit (INDEPENDENT_AMBULATORY_CARE_PROVIDER_SITE_OTHER): Payer: Medicare Other | Admitting: Ophthalmology

## 2021-07-22 DIAGNOSIS — H353211 Exudative age-related macular degeneration, right eye, with active choroidal neovascularization: Secondary | ICD-10-CM | POA: Diagnosis not present

## 2021-07-22 DIAGNOSIS — H353221 Exudative age-related macular degeneration, left eye, with active choroidal neovascularization: Secondary | ICD-10-CM

## 2021-07-22 MED ORDER — BEVACIZUMAB 2.5 MG/0.1ML IZ SOSY
2.5000 mg | PREFILLED_SYRINGE | INTRAVITREAL | Status: AC | PRN
  Administered 2021-07-22: 2.5 mg via INTRAVITREAL

## 2021-07-22 NOTE — Progress Notes (Signed)
07/22/2021     CHIEF COMPLAINT Patient presents for  Chief Complaint  Patient presents with   Retina Follow Up      HISTORY OF PRESENT ILLNESS: Victor Walker is a 65 y.o. male who presents to the clinic today for:   HPI     Retina Follow Up   Patient presents with  Wet AMD.  In left eye.  This started 6 weeks ago.  Severity is mild.  Duration of 6 weeks.  Since onset it is gradually worsening.        Comments   6 week fu OS and Avastin OS Pt states, "My right eye still has this curved and lop sided vision. My left eye seems to have double vision some but not a lot. "       Last edited by [name], COA on 07/22/2021  3:42 PM.      Referring physician: [name], MD [address] [place],  [place] [address]  HISTORICAL INFORMATION:   Selected notes from the MEDICAL RECORD NUMBER       CURRENT MEDICATIONS: Current Outpatient Medications (Ophthalmic Drugs)  Medication Sig   ofloxacin (OCUFLOX) 0.3 % ophthalmic solution  (Patient not taking: No sig reported)   prednisoLONE acetate (PRED FORTE) 1 % ophthalmic suspension SMARTSIG:In Eye(s) (Patient not taking: No sig reported)   No current facility-administered medications for this visit. (Ophthalmic Drugs)   Current Outpatient Medications (Other)  Medication Sig   atorvastatin (LIPITOR) 20 MG tablet Take 1 tablet by mouth daily.   clopidogrel (PLAVIX) 75 MG tablet Take 1 tablet (75 mg total) by mouth daily. (Patient not taking: Reported on 01/22/2021)   metoprolol tartrate (LOPRESSOR) 25 MG tablet Take 1 tablet (25 mg total) by mouth 2 (two) times daily.   nitroGLYCERIN (NITROSTAT) 0.4 MG SL tablet Place 1 tablet (0.4 mg total) under the tongue every 5 (five) minutes as needed for chest pain.   terbinafine (LAMISIL) 250 MG tablet Take 1 tablet (250 mg total) by mouth daily.   traMADol (ULTRAM) 50 MG tablet Take 1-2 tablets by mouth as needed.   No current  facility-administered medications for this visit. (Other)      REVIEW OF SYSTEMS:    ALLERGIES Allergies  Allergen Reactions   Aspirin Anaphylaxis   Penicillins Anaphylaxis    Has patient had a PCN reaction causing immediate rash, facial/tongue/throat swelling, SOB or lightheadedness with hypotension:YES Has patient had a PCN reaction causing severe rash involving mucus membranes or skin necrosis: NO Has patient had a PCN reaction that required hospitalization NO Has patient had a PCN reaction occurring within the last 10 years:NO If all of the above answers are "NO", then may proceed with Cephalosporin use.   Shrimp [Shellfish Allergy] Anaphylaxis    PAST MEDICAL HISTORY Past Medical History:  Diagnosis Date   Corneal abrasion, right, initial encounter 02/20/2021   Patient awakened this morning with a foreign body sensation in his eye.   H/O bilateral inguinal hernia repair    TB (pulmonary tuberculosis)    Past Surgical History:  Procedure Laterality Date   CATARACT EXTRACTION Right 01/29/2021   Dr. [name]   CATARACT EXTRACTION Left 02/05/2021   Dr. [name]   HERNIA REPAIR     KIDNEY STONE SURGERY      FAMILY HISTORY Family History  Problem Relation Age of Onset   Diabetes Mother    Hypertension Mother     SOCIAL HISTORY Social History  Tobacco Use   Smoking status: Every Day   Smokeless tobacco: Never  Vaping Use   Vaping Use: Never used  Substance Use Topics   Alcohol use: Yes   Drug use: Yes    Types: Marijuana         OPHTHALMIC EXAM:  Base Eye Exam     Visual Acuity (ETDRS)       Right Left   Dist [place] 20/30 -1 20/60 -1   Dist ph [place] 20/25 +1         Tonometry (Tonopen, 3:44 PM)       Right Left   Pressure 12 12         Pupils       Pupils Dark Light Shape React APD   Right PERRL 4 3 Round Slow None   Left PERRL 4 3 Round Slow None         Visual Fields (Counting fingers)       Left Right    Full Full          Extraocular Movement       Right Left    Full Full         Neuro/Psych     Oriented x3: Yes   Mood/Affect: Normal         Dilation     Left eye: 1.0% Mydriacyl, 2.5% Phenylephrine @ 3:44 PM           Slit Lamp and Fundus Exam     External Exam       Right Left   External Normal Normal         Slit Lamp Exam       Right Left   Lids/Lashes Normal Normal   Conjunctiva/Sclera White and quiet White and quiet   Cornea Clear, no epi defects Clear   Anterior Chamber Deep and quiet,,  no cells,  Deep and quiet   Iris Round and reactive Round and reactive   Lens Centered posterior chamber intraocular lens Centered posterior chamber intraocular lens   Anterior Vitreous Normal Normal         Fundus Exam       Right Left   Posterior Vitreous  Posterior vitreous detachment   Disc  Normal   C/D Ratio  0.2   Macula  Pigmented nodule superonasal to the macula foveal region left eye, smaller ring of atrophy no subretinal fluid   Vessels  Normal   Periphery  Normal            IMAGING AND PROCEDURES  Imaging and Procedures for 07/22/21  OCT, Retina - OU - Both Eyes       Right Eye Quality was good. Scan locations included subfoveal. Central Foveal Thickness: 268. Progression has improved. Findings include abnormal foveal contour, intraretinal fluid, disciform scar.   Left Eye Quality was good. Scan locations included subfoveal. Central Foveal Thickness: 290. Progression has been stable. Findings include abnormal foveal contour, intraretinal fluid, disciform scar, subretinal hyper-reflective material.   Notes Improving macular condition in each eye post recent Avastin OD, and less subretinal fluid.  Today at 2-week post last injection persistent intraretinal fluid over the lesion nasal to the fovea.  Subretinal fluid subfoveal as compared to March 2022 has resolved, watch region nasal to FAZ no overall much improved  OS, still active superonasal to the  FAZ, repeat examination left eye  for residual intraretinal fluid temporally, currently at 5w 6d-week interval stable.  Repeat injection OS today  to maintain less active disease superonasal to FAZ     Intravitreal Injection, Pharmacologic Agent - OS - Left Eye       Time Out 07/22/2021. 4:30 PM. Confirmed correct patient, procedure, site, and patient consented.   Anesthesia Topical anesthesia was used. Anesthetic medications included Akten 3.5%.   Procedure Preparation included Tobramycin 0.3%, Ofloxacin , 10% betadine to eyelids, 5% betadine to ocular surface. A 30 gauge needle was used.   Injection: 2.5 mg bevacizumab 2.5 MG/0.1ML   Route: Intravitreal, Site: Left Eye   NDC: [phone], Lot: [id number]   Post-op Post injection exam found visual acuity of at least counting fingers. The patient tolerated the procedure well. There were no complications. The patient received written and verbal post procedure care education. Post injection medications were not given.              ASSESSMENT/PLAN:  Exudative age-related macular degeneration of left eye with active choroidal neovascularization (HCC) Much less active macular disease superonasal to FAZ with less intraretinal fluid, currently at 6-week follow-up today repeat injection today and again in 6 weeks OS  Exudative age-related macular degeneration of right eye with active choroidal neovascularization (HCC) Today at 2 weeks post most recent injection OD follow-up as scheduled improving macular condition     ICD-10-CM   1. Exudative age-related macular degeneration of left eye with active choroidal neovascularization (HCC)  H35.3221 OCT, Retina - OU - Both Eyes    Intravitreal Injection, Pharmacologic Agent - OS - Left Eye    bevacizumab (AVASTIN) SOSY 2.5 mg    2. Exudative age-related macular degeneration of right eye with active choroidal neovascularization (HCC)  H35.3211       1.  Much less active disease CNVM OS  overall today at 6-week follow-up interval.  Repeat injection today and follow-up again in 6 weeks  2.  OD now 2 weeks post most recent injection and improving macular condition less active CNVM follow-up as scheduled  3.  Ophthalmic Meds Ordered this visit:  Meds ordered this encounter  Medications   bevacizumab (AVASTIN) SOSY 2.5 mg       Return in about 6 weeks (around 09/02/2021) for dilate, OS, AVASTIN OCT.  There are no Patient Instructions on file for this visit.   Explained the diagnoses, plan, and follow up with the patient and they expressed understanding.  Patient expressed understanding of the importance of proper follow up care.   Alford Highland Nashira Mcglynn M.D. Diseases & Surgery of the Retina and Vitreous [facility] 07/22/21     Abbreviations: M myopia (nearsighted); A astigmatism; H hyperopia (farsighted); P presbyopia; Mrx spectacle prescription;  CTL contact lenses; OD right eye; OS left eye; OU both eyes  XT exotropia; ET esotropia; PEK punctate epithelial keratitis; PEE punctate epithelial erosions; DES dry eye syndrome; MGD meibomian gland dysfunction; ATs artificial tears; PFAT's preservative free artificial tears; NSC nuclear sclerotic cataract; PSC posterior subcapsular cataract; ERM epi-retinal membrane; PVD posterior vitreous detachment; RD retinal detachment; DM diabetes mellitus; DR diabetic retinopathy; NPDR non-proliferative diabetic retinopathy; PDR proliferative diabetic retinopathy; CSME clinically significant macular edema; DME diabetic macular edema; dbh dot blot hemorrhages; CWS cotton wool spot; POAG primary open angle glaucoma; C/D cup-to-disc ratio; HVF humphrey visual field; GVF goldmann visual field; OCT optical coherence tomography; IOP intraocular pressure; BRVO Branch retinal vein occlusion; CRVO central retinal vein occlusion; CRAO central retinal artery occlusion; BRAO branch retinal artery occlusion; RT retinal tear; SB scleral  buckle; PPV pars plana vitrectomy; VH  Vitreous hemorrhage; PRP panretinal laser photocoagulation; IVK intravitreal kenalog; VMT vitreomacular traction; MH Macular hole;  NVD neovascularization of the disc; NVE neovascularization elsewhere; AREDS age related eye disease study; ARMD age related macular degeneration; POAG primary open angle glaucoma; EBMD epithelial/anterior basement membrane dystrophy; ACIOL anterior chamber intraocular lens; IOL intraocular lens; PCIOL posterior chamber intraocular lens; Phaco/IOL phacoemulsification with intraocular lens placement; [place] photorefractive keratectomy; LASIK laser assisted in situ keratomileusis; HTN hypertension; DM diabetes mellitus; COPD chronic obstructive pulmonary disease

## 2021-07-22 NOTE — Assessment & Plan Note (Signed)
Today at 2 weeks post most recent injection OD follow-up as scheduled improving macular condition

## 2021-07-22 NOTE — Assessment & Plan Note (Signed)
Much less active macular disease superonasal to FAZ with less intraretinal fluid, currently at 6-week follow-up today repeat injection today and again in 6 weeks OS

## 2021-07-23 ENCOUNTER — Encounter (INDEPENDENT_AMBULATORY_CARE_PROVIDER_SITE_OTHER): Payer: Medicare Other | Admitting: Ophthalmology

## 2021-08-19 ENCOUNTER — Other Ambulatory Visit: Payer: Self-pay

## 2021-08-19 ENCOUNTER — Ambulatory Visit (INDEPENDENT_AMBULATORY_CARE_PROVIDER_SITE_OTHER): Payer: Medicare Other | Admitting: Ophthalmology

## 2021-08-19 ENCOUNTER — Encounter (INDEPENDENT_AMBULATORY_CARE_PROVIDER_SITE_OTHER): Payer: Self-pay | Admitting: Ophthalmology

## 2021-08-19 DIAGNOSIS — H353211 Exudative age-related macular degeneration, right eye, with active choroidal neovascularization: Secondary | ICD-10-CM | POA: Diagnosis not present

## 2021-08-19 DIAGNOSIS — H353221 Exudative age-related macular degeneration, left eye, with active choroidal neovascularization: Secondary | ICD-10-CM | POA: Diagnosis not present

## 2021-08-19 MED ORDER — BEVACIZUMAB 2.5 MG/0.1ML IZ SOSY
2.5000 mg | PREFILLED_SYRINGE | INTRAVITREAL | Status: AC | PRN
  Administered 2021-08-19: 2.5 mg via INTRAVITREAL

## 2021-08-19 NOTE — Progress Notes (Signed)
08/19/2021     CHIEF COMPLAINT Patient presents for  Chief Complaint  Patient presents with   Retina Follow Up      HISTORY OF PRESENT ILLNESS: Victor Walker is a 65 y.o. male who presents to the clinic today for:   HPI     Retina Follow Up   Patient presents with  Wet AMD.  In right eye.  This started 6 weeks ago.  Severity is mild.  Duration of 6 weeks.  Since onset it is stable.        Comments   6 week fu OD and OCT and Avastin OD  Pt states [facility] OU stable since last visit. Pt denies FOL, floaters, or ocular pain OU.  Pt states, "I still have a blurry distortion with some waviness in my right eye."       Last edited by [name], COA on 08/19/2021  3:53 PM.      Referring physician: [name], MD [address] [place],  [place] [address]  HISTORICAL INFORMATION:   Selected notes from the MEDICAL RECORD NUMBER       CURRENT MEDICATIONS: Current Outpatient Medications (Ophthalmic Drugs)  Medication Sig   ofloxacin (OCUFLOX) 0.3 % ophthalmic solution  (Patient not taking: No sig reported)   prednisoLONE acetate (PRED FORTE) 1 % ophthalmic suspension SMARTSIG:In Eye(s) (Patient not taking: No sig reported)   No current facility-administered medications for this visit. (Ophthalmic Drugs)   Current Outpatient Medications (Other)  Medication Sig   atorvastatin (LIPITOR) 20 MG tablet Take 1 tablet by mouth daily.   clopidogrel (PLAVIX) 75 MG tablet Take 1 tablet (75 mg total) by mouth daily. (Patient not taking: Reported on 01/22/2021)   metoprolol tartrate (LOPRESSOR) 25 MG tablet Take 1 tablet (25 mg total) by mouth 2 (two) times daily.   nitroGLYCERIN (NITROSTAT) 0.4 MG SL tablet Place 1 tablet (0.4 mg total) under the tongue every 5 (five) minutes as needed for chest pain.   terbinafine (LAMISIL) 250 MG tablet Take 1 tablet (250 mg total) by mouth daily.   traMADol (ULTRAM) 50 MG tablet Take 1-2 tablets by mouth as needed.    No current facility-administered medications for this visit. (Other)      REVIEW OF SYSTEMS:    ALLERGIES Allergies  Allergen Reactions   Aspirin Anaphylaxis   Penicillins Anaphylaxis    Has patient had a PCN reaction causing immediate rash, facial/tongue/throat swelling, SOB or lightheadedness with hypotension:YES Has patient had a PCN reaction causing severe rash involving mucus membranes or skin necrosis: NO Has patient had a PCN reaction that required hospitalization NO Has patient had a PCN reaction occurring within the last 10 years:NO If all of the above answers are "NO", then may proceed with Cephalosporin use.   Shrimp [Shellfish Allergy] Anaphylaxis    PAST MEDICAL HISTORY Past Medical History:  Diagnosis Date   Corneal abrasion, right, initial encounter 02/20/2021   Patient awakened this morning with a foreign body sensation in his eye.   H/O bilateral inguinal hernia repair    TB (pulmonary tuberculosis)    Past Surgical History:  Procedure Laterality Date   CATARACT EXTRACTION Right 01/29/2021   Dr. [name]   CATARACT EXTRACTION Left 02/05/2021   Dr. [name]   HERNIA REPAIR     KIDNEY STONE SURGERY      FAMILY HISTORY Family History  Problem Relation Age of Onset   Diabetes Mother    Hypertension Mother  SOCIAL HISTORY Social History   Tobacco Use   Smoking status: Every Day   Smokeless tobacco: Never  Vaping Use   Vaping Use: Never used  Substance Use Topics   Alcohol use: Yes   Drug use: Yes    Types: Marijuana         OPHTHALMIC EXAM:  Base Eye Exam     Visual Acuity (ETDRS)       Right Left   Dist [place] 20/40 -2 20/25   Dist ph [place] NI          Tonometry (Tonopen, 3:57 PM)       Right Left   Pressure 12 12         Pupils       Pupils Dark Light Shape React APD   Right PERRL 4 3 Round Slow None   Left PERRL 4 3 Round Slow None         Visual Fields (Counting fingers)       Left Right    Full Full          Extraocular Movement       Right Left    Full Full         Neuro/Psych     Oriented x3: Yes   Mood/Affect: Normal         Dilation     Right eye: 1.0% Mydriacyl, 2.5% Phenylephrine @ 3:57 PM           Slit Lamp and Fundus Exam     External Exam       Right Left   External Normal Normal         Slit Lamp Exam       Right Left   Lids/Lashes Normal    Conjunctiva/Sclera White and quiet    Cornea Clear, no epi defects    Anterior Chamber Deep and quiet,,  no cells,     Iris Round and reactive    Lens Centered posterior chamber intraocular lens    Anterior Vitreous Normal          Fundus Exam       Right Left   Posterior Vitreous Posterior vitreous detachment    Disc Normal    C/D Ratio 0.2    Macula Less macular thickening, Hard drusen, Retinal pigment epithelial mottling, Retinal pigment epithelial detachment, Subretinal neovascular membrane much less active    Vessels Normal    Periphery Normal             IMAGING AND PROCEDURES  Imaging and Procedures for 08/19/21  OCT, Retina - OU - Both Eyes       Right Eye Quality was good. Scan locations included subfoveal. Central Foveal Thickness: 264. Progression has improved. Findings include abnormal foveal contour, intraretinal fluid, disciform scar.   Left Eye Quality was good. Scan locations included subfoveal. Central Foveal Thickness: 280. Progression has been stable. Findings include abnormal foveal contour, intraretinal fluid, disciform scar, subretinal hyper-reflective material.   Notes Improving macular condition in each eye post recent Avastin OD, and less subretinal fluid.  Today at 6-week postinjection with less subretinal fluid  Repeat injection OD today OS, still active superonasal to the FAZ, repeat examination left eye  for residual intraretinal fluid temporally, currently at 4 weeks week interval, improved and stable     Intravitreal Injection, Pharmacologic Agent - OD  - Right Eye       Time Out 08/19/2021. 4:29 PM. Confirmed correct patient, procedure, site, and  patient consented.   Anesthesia Topical anesthesia was used. Anesthetic medications included Akten 3.5%.   Procedure Preparation included 5% betadine to ocular surface, 10% betadine to eyelids, Tobramycin 0.3%. A 30 gauge needle was used.   Injection: 2.5 mg bevacizumab 2.5 MG/0.1ML   Route: Intravitreal, Site: Right Eye   NDC: [phone], Lot: [id number]   Post-op Post injection exam found visual acuity of at least counting fingers. The patient tolerated the procedure well. There were no complications. The patient received written and verbal post procedure care education. Post injection medications included ocuflox.              ASSESSMENT/PLAN:  Exudative age-related macular degeneration of right eye with active choroidal neovascularization (HCC) Vastly improved macular findings now with less subfoveal activity currently at 6-week interval follow-up post Avastin repeat injection today and follow-up again in 6 weeks OD  Exudative age-related macular degeneration of left eye with active choroidal neovascularization (HCC) OS with no subfoveal involvement but less active and disease superonasal to FAZ.  Follow-up as scheduled      ICD-10-CM   1. Exudative age-related macular degeneration of right eye with active choroidal neovascularization (HCC)  H35.3211 OCT, Retina - OU - Both Eyes    Intravitreal Injection, Pharmacologic Agent - OD - Right Eye    bevacizumab (AVASTIN) SOSY 2.5 mg    2. Exudative age-related macular degeneration of left eye with active choroidal neovascularization (HCC)  H35.3221       1.  OD repeat injection intravitreal Avastin today at 6-week interval and maintain until condition has completely resolved  2.  OS follow-up as scheduled  3.  Ophthalmic Meds Ordered this visit:  Meds ordered this encounter  Medications   bevacizumab (AVASTIN) SOSY 2.5 mg        Return in about 6 weeks (around 09/30/2021) for dilate, OD, AVASTIN OCT,, and follow-up OS as scheduled.  There are no Patient Instructions on file for this visit.   Explained the diagnoses, plan, and follow up with the patient and they expressed understanding.  Patient expressed understanding of the importance of proper follow up care.   Alford Highland Mahika Vanvoorhis M.D. Diseases & Surgery of the Retina and Vitreous [facility] 08/19/21     Abbreviations: M myopia (nearsighted); A astigmatism; H hyperopia (farsighted); P presbyopia; Mrx spectacle prescription;  CTL contact lenses; OD right eye; OS left eye; OU both eyes  XT exotropia; ET esotropia; PEK punctate epithelial keratitis; PEE punctate epithelial erosions; DES dry eye syndrome; MGD meibomian gland dysfunction; ATs artificial tears; PFAT's preservative free artificial tears; NSC nuclear sclerotic cataract; PSC posterior subcapsular cataract; ERM epi-retinal membrane; PVD posterior vitreous detachment; RD retinal detachment; DM diabetes mellitus; DR diabetic retinopathy; NPDR non-proliferative diabetic retinopathy; PDR proliferative diabetic retinopathy; CSME clinically significant macular edema; DME diabetic macular edema; dbh dot blot hemorrhages; CWS cotton wool spot; POAG primary open angle glaucoma; C/D cup-to-disc ratio; HVF humphrey visual field; GVF goldmann visual field; OCT optical coherence tomography; IOP intraocular pressure; BRVO Branch retinal vein occlusion; CRVO central retinal vein occlusion; CRAO central retinal artery occlusion; BRAO branch retinal artery occlusion; RT retinal tear; SB scleral buckle; PPV pars plana vitrectomy; VH Vitreous hemorrhage; PRP panretinal laser photocoagulation; IVK intravitreal kenalog; VMT vitreomacular traction; MH Macular hole;  NVD neovascularization of the disc; NVE neovascularization elsewhere; AREDS age related eye disease study; ARMD age related macular degeneration; POAG  primary open angle glaucoma; EBMD epithelial/anterior basement membrane dystrophy; ACIOL anterior chamber intraocular lens; IOL intraocular lens;  PCIOL posterior chamber intraocular lens; Phaco/IOL phacoemulsification with intraocular lens placement; [place] photorefractive keratectomy; LASIK laser assisted in situ keratomileusis; HTN hypertension; DM diabetes mellitus; COPD chronic obstructive pulmonary disease

## 2021-08-19 NOTE — Assessment & Plan Note (Signed)
OS with no subfoveal involvement but less active and disease superonasal to FAZ.  Follow-up as scheduled

## 2021-08-19 NOTE — Assessment & Plan Note (Signed)
Vastly improved macular findings now with less subfoveal activity currently at 6-week interval follow-up post Avastin repeat injection today and follow-up again in 6 weeks OD

## 2021-08-26 ENCOUNTER — Encounter (INDEPENDENT_AMBULATORY_CARE_PROVIDER_SITE_OTHER): Payer: Medicare Other | Admitting: Ophthalmology

## 2021-09-02 ENCOUNTER — Other Ambulatory Visit: Payer: Self-pay

## 2021-09-02 ENCOUNTER — Ambulatory Visit (INDEPENDENT_AMBULATORY_CARE_PROVIDER_SITE_OTHER): Payer: Medicare Other | Admitting: Ophthalmology

## 2021-09-02 ENCOUNTER — Encounter (INDEPENDENT_AMBULATORY_CARE_PROVIDER_SITE_OTHER): Payer: Self-pay | Admitting: Ophthalmology

## 2021-09-02 DIAGNOSIS — H353221 Exudative age-related macular degeneration, left eye, with active choroidal neovascularization: Secondary | ICD-10-CM | POA: Diagnosis not present

## 2021-09-02 DIAGNOSIS — H353211 Exudative age-related macular degeneration, right eye, with active choroidal neovascularization: Secondary | ICD-10-CM

## 2021-09-02 MED ORDER — BEVACIZUMAB 2.5 MG/0.1ML IZ SOSY
2.5000 mg | PREFILLED_SYRINGE | INTRAVITREAL | Status: AC | PRN
  Administered 2021-09-02: 2.5 mg via INTRAVITREAL

## 2021-09-02 NOTE — Assessment & Plan Note (Signed)
Today at 2 weeks postinjection Avastin improving macular anatomy by OCT

## 2021-09-02 NOTE — Assessment & Plan Note (Signed)
OS today is 6 weeks post injections lesion superonasal to the fovea has continued slow and steady improvement also with improved acuity and symptomatology repeat injection today and again examination in 6 weeks

## 2021-09-02 NOTE — Progress Notes (Signed)
09/02/2021     CHIEF COMPLAINT Patient presents for  Chief Complaint  Patient presents with   Retina Follow Up      HISTORY OF PRESENT ILLNESS: Victor Walker is a 65 y.o. male who presents to the clinic today for:   HPI     Retina Follow Up   Patient presents with  Wet AMD.  In left eye.  This started 6 weeks ago.  Severity is mild.  Duration of 6 weeks.  Since onset it is stable.        Comments   6 week fu OS oct avastin OS. Patient states "it seems like the double vision has slowed down a lot. The curvyness in the right eye has moved further to the right out of the line of sight, it was more centered but now its moved. Overall together my sight is getting better." Pt is not using any eyedrops currently.       Last edited by Nelva Nay on 09/02/2021  4:18 PM.      Referring physician: [name], MD [address] [place],  [place] [address]  HISTORICAL INFORMATION:   Selected notes from the MEDICAL RECORD NUMBER       CURRENT MEDICATIONS: Current Outpatient Medications (Ophthalmic Drugs)  Medication Sig   ofloxacin (OCUFLOX) 0.3 % ophthalmic solution  (Patient not taking: No sig reported)   prednisoLONE acetate (PRED FORTE) 1 % ophthalmic suspension SMARTSIG:In Eye(s) (Patient not taking: No sig reported)   No current facility-administered medications for this visit. (Ophthalmic Drugs)   Current Outpatient Medications (Other)  Medication Sig   atorvastatin (LIPITOR) 20 MG tablet Take 1 tablet by mouth daily.   clopidogrel (PLAVIX) 75 MG tablet Take 1 tablet (75 mg total) by mouth daily. (Patient not taking: Reported on 01/22/2021)   metoprolol tartrate (LOPRESSOR) 25 MG tablet Take 1 tablet (25 mg total) by mouth 2 (two) times daily.   nitroGLYCERIN (NITROSTAT) 0.4 MG SL tablet Place 1 tablet (0.4 mg total) under the tongue every 5 (five) minutes as needed for chest pain.   terbinafine (LAMISIL) 250 MG tablet Take 1  tablet (250 mg total) by mouth daily.   traMADol (ULTRAM) 50 MG tablet Take 1-2 tablets by mouth as needed.   No current facility-administered medications for this visit. (Other)      REVIEW OF SYSTEMS: ROS   Negative for: Constitutional, Gastrointestinal, Neurological, Skin, Genitourinary, Musculoskeletal, HENT, Endocrine, Cardiovascular, Eyes, Respiratory, Psychiatric, Allergic/Imm, Heme/Lymph Last edited by Edmon Crape, MD on 09/02/2021  4:47 PM.       ALLERGIES Allergies  Allergen Reactions   Aspirin Anaphylaxis   Penicillins Anaphylaxis    Has patient had a PCN reaction causing immediate rash, facial/tongue/throat swelling, SOB or lightheadedness with hypotension:YES Has patient had a PCN reaction causing severe rash involving mucus membranes or skin necrosis: NO Has patient had a PCN reaction that required hospitalization NO Has patient had a PCN reaction occurring within the last 10 years:NO If all of the above answers are "NO", then may proceed with Cephalosporin use.   Shrimp [Shellfish Allergy] Anaphylaxis    PAST MEDICAL HISTORY Past Medical History:  Diagnosis Date   Corneal abrasion, right, initial encounter 02/20/2021   Patient awakened this morning with a foreign body sensation in his eye.   H/O bilateral inguinal hernia repair    TB (pulmonary tuberculosis)    Past Surgical History:  Procedure Laterality Date   CATARACT EXTRACTION Right  01/29/2021   Dr. [name]   CATARACT EXTRACTION Left 02/05/2021   Dr. [name]   HERNIA REPAIR     KIDNEY STONE SURGERY      FAMILY HISTORY Family History  Problem Relation Age of Onset   Diabetes Mother    Hypertension Mother     SOCIAL HISTORY Social History   Tobacco Use   Smoking status: Every Day   Smokeless tobacco: Never  Vaping Use   Vaping Use: Never used  Substance Use Topics   Alcohol use: Yes   Drug use: Yes    Types: Marijuana         OPHTHALMIC EXAM:  Base Eye Exam     Visual  Acuity (ETDRS)       Right Left   Dist [place] 20/40 20/25 -1   Dist ph [place] NI   Right eye pt states "that has the distortion, waviness, and the left eye there is a little double vision" when PH.        Tonometry (Tonopen, 4:10 PM)       Right Left   Pressure 9 10         Pupils       Pupils Dark Light Shape React APD   Right PERRL 4 3 Round Slow None   Left PERRL 4 3 Round Slow None         Extraocular Movement       Right Left    Full Full         Neuro/Psych     Oriented x3: Yes   Mood/Affect: Normal         Dilation     Left eye: 1.0% Mydriacyl, 2.5% Phenylephrine @ 4:10 PM           Slit Lamp and Fundus Exam     External Exam       Right Left   External Normal Normal         Slit Lamp Exam       Right Left   Lids/Lashes Normal Normal   Conjunctiva/Sclera White and quiet White and quiet   Cornea Clear, no epi defects Clear   Anterior Chamber Deep and quiet,,  no cells,  Deep and quiet   Iris Round and reactive Round and reactive   Lens Centered posterior chamber intraocular lens Centered posterior chamber intraocular lens   Anterior Vitreous Normal Normal         Fundus Exam       Right Left   Posterior Vitreous  Posterior vitreous detachment   Disc  Normal   C/D Ratio  0.2   Macula  Pigmented nodule superonasal to the macula foveal region left eye, smaller ring of atrophy no subretinal fluid, smaller today   Vessels  Normal   Periphery  Normal            IMAGING AND PROCEDURES  Imaging and Procedures for 09/02/21  OCT, Retina - OU - Both Eyes       Right Eye Quality was good. Scan locations included subfoveal. Central Foveal Thickness: 262. Progression has improved. Findings include abnormal foveal contour, intraretinal fluid, disciform scar.   Left Eye Quality was good. Scan locations included subfoveal. Central Foveal Thickness: 289. Progression has been stable. Findings include abnormal foveal contour,  intraretinal fluid, disciform scar, subretinal hyper-reflective material.   Notes Improving macular condition in each eye post recent Avastin OD, and less subretinal fluid.  Today at 2-week postinjection with less subretinal fluid  Repeat examination OD as scheduled   OS, still active superonasal to the FAZ, repeat examination left eye  for residual intraretinal fluid temporally, currently at 6 weeks week interval, improved and stable, repeat injection today to maintain     Intravitreal Injection, Pharmacologic Agent - OS - Left Eye       Time Out 09/02/2021. 4:47 PM. Confirmed correct patient, procedure, site, and patient consented.   Anesthesia Topical anesthesia was used. Anesthetic medications included Akten 3.5%.   Procedure Preparation included Tobramycin 0.3%, Ofloxacin , 10% betadine to eyelids, 5% betadine to ocular surface. A 30 gauge needle was used.   Injection: 2.5 mg bevacizumab 2.5 MG/0.1ML   Route: Intravitreal, Site: Left Eye   NDC: [phone], Lot: [id number]   Post-op Post injection exam found visual acuity of at least counting fingers. The patient tolerated the procedure well. There were no complications. The patient received written and verbal post procedure care education. Post injection medications included ocuflox.              ASSESSMENT/PLAN:  Exudative age-related macular degeneration of right eye with active choroidal neovascularization (HCC) Today at 2 weeks postinjection Avastin improving macular anatomy by OCT  Exudative age-related macular degeneration of left eye with active choroidal neovascularization (HCC) OS today is 6 weeks post injections lesion superonasal to the fovea has continued slow and steady improvement also with improved acuity and symptomatology repeat injection today and again examination in 6 weeks     ICD-10-CM   1. 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 Exudative age-related macular degeneration of right eye with active choroidal neovascularization (HCC)  H35.3211       1.OS today is 6 weeks post injections lesion superonasal to the fovea has continued slow and steady improvement also with improved acuity and symptomatology repeat injection today and again examination in 6 weeks  2.  OD follow-up dilate next as scheduled  3.  Ophthalmic Meds Ordered this visit:  Meds ordered this encounter  Medications   bevacizumab (AVASTIN) SOSY 2.5 mg       Return in about 6 weeks (around 10/14/2021) for dilate, OS, AVASTIN OCT.  There are no Patient Instructions on file for this visit.   Explained the diagnoses, plan, and follow up with the patient and they expressed understanding.  Patient expressed understanding of the importance of proper follow up care.   Alford Highland Seraphina Mitchner M.D. Diseases & Surgery of the Retina and Vitreous [facility] 09/02/21     Abbreviations: M myopia (nearsighted); A astigmatism; H hyperopia (farsighted); P presbyopia; Mrx spectacle prescription;  CTL contact lenses; OD right eye; OS left eye; OU both eyes  XT exotropia; ET esotropia; PEK punctate epithelial keratitis; PEE punctate epithelial erosions; DES dry eye syndrome; MGD meibomian gland dysfunction; ATs artificial tears; PFAT's preservative free artificial tears; NSC nuclear sclerotic cataract; PSC posterior subcapsular cataract; ERM epi-retinal membrane; PVD posterior vitreous detachment; RD retinal detachment; DM diabetes mellitus; DR diabetic retinopathy; NPDR non-proliferative diabetic retinopathy; PDR proliferative diabetic retinopathy; CSME clinically significant macular edema; DME diabetic macular edema; dbh dot blot hemorrhages; CWS cotton wool spot; POAG primary open angle glaucoma; C/D  cup-to-disc ratio; HVF humphrey visual field; GVF goldmann visual field; OCT optical coherence tomography; IOP intraocular pressure; BRVO Branch  retinal vein occlusion; CRVO central retinal vein occlusion; CRAO central retinal artery occlusion; BRAO branch retinal artery occlusion; RT retinal tear; SB scleral buckle; PPV pars plana vitrectomy; VH Vitreous hemorrhage; PRP panretinal laser photocoagulation; IVK intravitreal kenalog; VMT vitreomacular traction; MH Macular hole;  NVD neovascularization of the disc; NVE neovascularization elsewhere; AREDS age related eye disease study; ARMD age related macular degeneration; POAG primary open angle glaucoma; EBMD epithelial/anterior basement membrane dystrophy; ACIOL anterior chamber intraocular lens; IOL intraocular lens; PCIOL posterior chamber intraocular lens; Phaco/IOL phacoemulsification with intraocular lens placement; [place] photorefractive keratectomy; LASIK laser assisted in situ keratomileusis; HTN hypertension; DM diabetes mellitus; COPD chronic obstructive pulmonary disease

## 2021-09-30 ENCOUNTER — Other Ambulatory Visit: Payer: Self-pay

## 2021-09-30 ENCOUNTER — Encounter (INDEPENDENT_AMBULATORY_CARE_PROVIDER_SITE_OTHER): Payer: Self-pay | Admitting: Ophthalmology

## 2021-09-30 ENCOUNTER — Ambulatory Visit (INDEPENDENT_AMBULATORY_CARE_PROVIDER_SITE_OTHER): Payer: Medicare Other | Admitting: Ophthalmology

## 2021-09-30 DIAGNOSIS — H353221 Exudative age-related macular degeneration, left eye, with active choroidal neovascularization: Secondary | ICD-10-CM | POA: Diagnosis not present

## 2021-09-30 DIAGNOSIS — H353211 Exudative age-related macular degeneration, right eye, with active choroidal neovascularization: Secondary | ICD-10-CM | POA: Diagnosis not present

## 2021-09-30 MED ORDER — BEVACIZUMAB 2.5 MG/0.1ML IZ SOSY
2.5000 mg | PREFILLED_SYRINGE | INTRAVITREAL | Status: AC | PRN
  Administered 2021-09-30: 2.5 mg via INTRAVITREAL

## 2021-09-30 NOTE — Assessment & Plan Note (Signed)
Chronic active CNVM, OD vastly improved no center involvement yet juxta foveal involvement OD at 6-week interval repeat injection today

## 2021-09-30 NOTE — Progress Notes (Signed)
09/30/2021     CHIEF COMPLAINT Patient presents for  Chief Complaint  Patient presents with   Retina Follow Up      HISTORY OF PRESENT ILLNESS: Victor Walker is a 65 y.o. male who presents to the clinic today for:   HPI     Retina Follow Up   Patient presents with  Wet AMD.  In right eye.  This started 6 weeks ago.  Duration of 6 weeks.  Since onset it is gradually worsening.        Comments   6 week f/u OD with OCT and possible Avastin injection OD  Pt c/o worsening visual disturbances with the right eye, states the distortion area has gotten larger in size and is now more in his central vision than before.      Last edited by Frederik Pear, COA on 09/30/2021  4:02 PM.      Referring physician: [name], MD [address] [place],  [place] [address]  HISTORICAL INFORMATION:   Selected notes from the MEDICAL RECORD NUMBER       CURRENT MEDICATIONS: Current Outpatient Medications (Ophthalmic Drugs)  Medication Sig   ofloxacin (OCUFLOX) 0.3 % ophthalmic solution  (Patient not taking: No sig reported)   prednisoLONE acetate (PRED FORTE) 1 % ophthalmic suspension SMARTSIG:In Eye(s) (Patient not taking: No sig reported)   No current facility-administered medications for this visit. (Ophthalmic Drugs)   Current Outpatient Medications (Other)  Medication Sig   atorvastatin (LIPITOR) 20 MG tablet Take 1 tablet by mouth daily.   clopidogrel (PLAVIX) 75 MG tablet Take 1 tablet (75 mg total) by mouth daily. (Patient not taking: Reported on 01/22/2021)   metoprolol tartrate (LOPRESSOR) 25 MG tablet Take 1 tablet (25 mg total) by mouth 2 (two) times daily.   nitroGLYCERIN (NITROSTAT) 0.4 MG SL tablet Place 1 tablet (0.4 mg total) under the tongue every 5 (five) minutes as needed for chest pain.   terbinafine (LAMISIL) 250 MG tablet Take 1 tablet (250 mg total) by mouth daily.   traMADol (ULTRAM) 50 MG tablet Take 1-2 tablets by mouth as  needed.   No current facility-administered medications for this visit. (Other)      REVIEW OF SYSTEMS:    ALLERGIES Allergies  Allergen Reactions   Aspirin Anaphylaxis   Penicillins Anaphylaxis    Has patient had a PCN reaction causing immediate rash, facial/tongue/throat swelling, SOB or lightheadedness with hypotension:YES Has patient had a PCN reaction causing severe rash involving mucus membranes or skin necrosis: NO Has patient had a PCN reaction that required hospitalization NO Has patient had a PCN reaction occurring within the last 10 years:NO If all of the above answers are "NO", then may proceed with Cephalosporin use.   Shrimp [Shellfish Allergy] Anaphylaxis    PAST MEDICAL HISTORY Past Medical History:  Diagnosis Date   Corneal abrasion, right, initial encounter 02/20/2021   Patient awakened this morning with a foreign body sensation in his eye.   H/O bilateral inguinal hernia repair    TB (pulmonary tuberculosis)    Past Surgical History:  Procedure Laterality Date   CATARACT EXTRACTION Right 01/29/2021   Dr. [name]   CATARACT EXTRACTION Left 02/05/2021   Dr. [name]   HERNIA REPAIR     KIDNEY STONE SURGERY      FAMILY HISTORY Family History  Problem Relation Age of Onset   Diabetes Mother    Hypertension Mother     SOCIAL HISTORY Social  History   Tobacco Use   Smoking status: Every Day   Smokeless tobacco: Never  Vaping Use   Vaping Use: Never used  Substance Use Topics   Alcohol use: Yes   Drug use: Yes    Types: Marijuana         OPHTHALMIC EXAM:  Base Eye Exam     Visual Acuity (ETDRS)       Right Left   Dist [place] 20/32 -2 20/32 -1   Dist ph [place] 20/25 +2 20/20 -1  Pt c/o diplopia while checking [facility], this occurred with both eyes individually.        Tonometry (Tonopen, 4:13 PM)       Right Left   Pressure 9 6         Pupils       Pupils Dark Light Shape React APD   Right PERRL 4 3 Round Brisk None   Left PERRL 4 3  Round Brisk None         Visual Fields (Counting fingers)       Left Right    Full Full         Extraocular Movement       Right Left    Full, Ortho Full, Ortho         Neuro/Psych     Oriented x3: Yes   Mood/Affect: Normal         Dilation     Right eye: 1.0% Mydriacyl, 2.5% Phenylephrine @ 4:11 PM           Slit Lamp and Fundus Exam     External Exam       Right Left   External Normal Normal         Slit Lamp Exam       Right Left   Lids/Lashes Normal Normal   Conjunctiva/Sclera White and quiet White and quiet   Cornea Clear, no epi defects Clear   Anterior Chamber Deep and quiet,,  no cells,  Deep and quiet   Iris Round and reactive Round and reactive   Lens Centered posterior chamber intraocular lens Centered posterior chamber intraocular lens   Anterior Vitreous Normal Normal         Fundus Exam       Right Left   Posterior Vitreous Posterior vitreous detachment    Disc Normal    C/D Ratio 0.2 0.2   Macula Less macular thickening, Hard drusen, Retinal pigment epithelial mottling, Retinal pigment epithelial detachment, Subretinal neovascular membrane much less active    Vessels Normal    Periphery Normal             IMAGING AND PROCEDURES  Imaging and Procedures for 09/30/21  OCT, Retina - OU - Both Eyes       Right Eye Quality was good. Scan locations included subfoveal. Central Foveal Thickness: 268. Progression has improved. Findings include abnormal foveal contour, intraretinal fluid, disciform scar.   Left Eye Quality was good. Scan locations included subfoveal. Central Foveal Thickness: 285. Progression has been stable. Findings include abnormal foveal contour, intraretinal fluid, disciform scar, subretinal hyper-reflective material.   Notes Improving macular condition in each eye post recent Avastin OD, and less subretinal fluid.  Today at 6-week postinjection with less subretinal fluid  Repeat injection OD  today   OS, still active superonasal to the FAZ, repeat examination left eye  for residual intraretinal fluid temporally, currently at  weeks week interval, improved and stable, and follow-up OS as  scheduled     Intravitreal Injection, Pharmacologic Agent - OD - Right Eye       Time Out 09/30/2021. 4:47 PM. Confirmed correct patient, procedure, site, and patient consented.   Anesthesia Topical anesthesia was used. Anesthetic medications included Lidocaine 4%.   Procedure Preparation included 5% betadine to ocular surface, 10% betadine to eyelids, Tobramycin 0.3%. A 30 gauge needle was used.   Injection: 2.5 mg bevacizumab 2.5 MG/0.1ML   Route: Intravitreal, Site: Right Eye   NDC: [phone], Lot: [id number]   Post-op Post injection exam found visual acuity of at least counting fingers. The patient tolerated the procedure well. There were no complications. The patient received written and verbal post procedure care education. Post injection medications included ocuflox.              ASSESSMENT/PLAN:  Exudative age-related macular degeneration of right eye with active choroidal neovascularization (HCC) Chronic active CNVM, OD vastly improved no center involvement yet juxta foveal involvement OD at 6-week interval repeat injection today  Exudative age-related macular degeneration of left eye with active choroidal neovascularization (HCC) OS still stable, follow-up as scheduled     ICD-10-CM   1. Exudative age-related macular degeneration of right eye with active choroidal neovascularization (HCC)  H35.3211 OCT, Retina - OU - Both Eyes    Intravitreal Injection, Pharmacologic Agent - OD - Right Eye    bevacizumab (AVASTIN) SOSY 2.5 mg    2. Exudative age-related macular degeneration of left eye with active choroidal neovascularization (HCC)  H35.3221       1.  OD still chronically active CNVM nasal to FAZ, will repeat injection intravitreal Avastin today to  maintain  2.  3.  Ophthalmic Meds Ordered this visit:  Meds ordered this encounter  Medications   bevacizumab (AVASTIN) SOSY 2.5 mg       Return in about 6 weeks (around 11/11/2021) for dilate, OD, AVASTIN OCT,, and OS as scheduled.  There are no Patient Instructions on file for this visit.   Explained the diagnoses, plan, and follow up with the patient and they expressed understanding.  Patient expressed understanding of the importance of proper follow up care.   Alford Highland Kaidin Boehle M.D. Diseases & Surgery of the Retina and Vitreous [facility] 09/30/21     Abbreviations: M myopia (nearsighted); A astigmatism; H hyperopia (farsighted); P presbyopia; Mrx spectacle prescription;  CTL contact lenses; OD right eye; OS left eye; OU both eyes  XT exotropia; ET esotropia; PEK punctate epithelial keratitis; PEE punctate epithelial erosions; DES dry eye syndrome; MGD meibomian gland dysfunction; ATs artificial tears; PFAT's preservative free artificial tears; NSC nuclear sclerotic cataract; PSC posterior subcapsular cataract; ERM epi-retinal membrane; PVD posterior vitreous detachment; RD retinal detachment; DM diabetes mellitus; DR diabetic retinopathy; NPDR non-proliferative diabetic retinopathy; PDR proliferative diabetic retinopathy; CSME clinically significant macular edema; DME diabetic macular edema; dbh dot blot hemorrhages; CWS cotton wool spot; POAG primary open angle glaucoma; C/D cup-to-disc ratio; HVF humphrey visual field; GVF goldmann visual field; OCT optical coherence tomography; IOP intraocular pressure; BRVO Branch retinal vein occlusion; CRVO central retinal vein occlusion; CRAO central retinal artery occlusion; BRAO branch retinal artery occlusion; RT retinal tear; SB scleral buckle; PPV pars plana vitrectomy; VH Vitreous hemorrhage; PRP panretinal laser photocoagulation; IVK intravitreal kenalog; VMT vitreomacular traction; MH Macular hole;  NVD  neovascularization of the disc; NVE neovascularization elsewhere; AREDS age related eye disease study; ARMD age related macular degeneration; POAG primary open angle glaucoma; EBMD epithelial/anterior basement membrane dystrophy; ACIOL  anterior chamber intraocular lens; IOL intraocular lens; PCIOL posterior chamber intraocular lens; Phaco/IOL phacoemulsification with intraocular lens placement; [place] photorefractive keratectomy; LASIK laser assisted in situ keratomileusis; HTN hypertension; DM diabetes mellitus; COPD chronic obstructive pulmonary disease

## 2021-09-30 NOTE — Assessment & Plan Note (Signed)
OS still stable, follow-up as scheduled

## 2021-10-14 ENCOUNTER — Encounter (INDEPENDENT_AMBULATORY_CARE_PROVIDER_SITE_OTHER): Payer: Medicare Other | Admitting: Ophthalmology

## 2021-10-14 ENCOUNTER — Encounter (INDEPENDENT_AMBULATORY_CARE_PROVIDER_SITE_OTHER): Payer: Self-pay | Admitting: Ophthalmology

## 2021-10-14 ENCOUNTER — Ambulatory Visit (INDEPENDENT_AMBULATORY_CARE_PROVIDER_SITE_OTHER): Payer: Medicare Other | Admitting: Ophthalmology

## 2021-10-14 ENCOUNTER — Other Ambulatory Visit: Payer: Self-pay

## 2021-10-14 DIAGNOSIS — H353221 Exudative age-related macular degeneration, left eye, with active choroidal neovascularization: Secondary | ICD-10-CM | POA: Diagnosis not present

## 2021-10-14 DIAGNOSIS — H353211 Exudative age-related macular degeneration, right eye, with active choroidal neovascularization: Secondary | ICD-10-CM | POA: Diagnosis not present

## 2021-10-14 MED ORDER — BEVACIZUMAB 2.5 MG/0.1ML IZ SOSY
2.5000 mg | PREFILLED_SYRINGE | INTRAVITREAL | Status: AC | PRN
Start: 2021-10-14 — End: 2021-10-14
  Administered 2021-10-14: 2.5 mg via INTRAVITREAL

## 2021-10-14 NOTE — Assessment & Plan Note (Signed)
Much improved OD, currently 2 weeks post most recent injection follow-up as scheduled

## 2021-10-14 NOTE — Progress Notes (Signed)
10/14/2021     CHIEF COMPLAINT Patient presents for  Chief Complaint  Patient presents with   Retina Follow Up      HISTORY OF PRESENT ILLNESS: Victor Walker is a 65 y.o. male who presents to the clinic today for:   HPI     Retina Follow Up   Patient presents with  Wet AMD.  In left eye.  This started 6 weeks ago.  Duration of 6 weeks.  Since onset it is gradually worsening.        Comments   6 week fu OS oct avastin OS. Patient states vision is stable and unchanged since last visit. Denies any new floaters or FOL.       Last edited by Nelva Nay on 10/14/2021  3:27 PM.      Referring physician: [name], MD [address] [place],  [place] [address]  HISTORICAL INFORMATION:   Selected notes from the MEDICAL RECORD NUMBER       CURRENT MEDICATIONS: Current Outpatient Medications (Ophthalmic Drugs)  Medication Sig   ofloxacin (OCUFLOX) 0.3 % ophthalmic solution  (Patient not taking: No sig reported)   prednisoLONE acetate (PRED FORTE) 1 % ophthalmic suspension SMARTSIG:In Eye(s) (Patient not taking: No sig reported)   No current facility-administered medications for this visit. (Ophthalmic Drugs)   Current Outpatient Medications (Other)  Medication Sig   atorvastatin (LIPITOR) 20 MG tablet Take 1 tablet by mouth daily.   clopidogrel (PLAVIX) 75 MG tablet Take 1 tablet (75 mg total) by mouth daily. (Patient not taking: Reported on 01/22/2021)   metoprolol tartrate (LOPRESSOR) 25 MG tablet Take 1 tablet (25 mg total) by mouth 2 (two) times daily.   nitroGLYCERIN (NITROSTAT) 0.4 MG SL tablet Place 1 tablet (0.4 mg total) under the tongue every 5 (five) minutes as needed for chest pain.   terbinafine (LAMISIL) 250 MG tablet Take 1 tablet (250 mg total) by mouth daily.   traMADol (ULTRAM) 50 MG tablet Take 1-2 tablets by mouth as needed.   No current facility-administered medications for this visit. (Other)      REVIEW  OF SYSTEMS:    ALLERGIES Allergies  Allergen Reactions   Aspirin Anaphylaxis   Penicillins Anaphylaxis    Has patient had a PCN reaction causing immediate rash, facial/tongue/throat swelling, SOB or lightheadedness with hypotension:YES Has patient had a PCN reaction causing severe rash involving mucus membranes or skin necrosis: NO Has patient had a PCN reaction that required hospitalization NO Has patient had a PCN reaction occurring within the last 10 years:NO If all of the above answers are "NO", then may proceed with Cephalosporin use.   Shrimp [Shellfish Allergy] Anaphylaxis    PAST MEDICAL HISTORY Past Medical History:  Diagnosis Date   Corneal abrasion, right, initial encounter 02/20/2021   Patient awakened this morning with a foreign body sensation in his eye.   H/O bilateral inguinal hernia repair    TB (pulmonary tuberculosis)    Past Surgical History:  Procedure Laterality Date   CATARACT EXTRACTION Right 01/29/2021   Dr. [name]   CATARACT EXTRACTION Left 02/05/2021   Dr. [name]   HERNIA REPAIR     KIDNEY STONE SURGERY      FAMILY HISTORY Family History  Problem Relation Age of Onset   Diabetes Mother    Hypertension Mother     SOCIAL HISTORY Social History   Tobacco Use   Smoking status: Every Day   Smokeless tobacco: Never  Vaping Use   Vaping Use: Never used  Substance Use Topics   Alcohol use: Yes   Drug use: Yes    Types: Marijuana         OPHTHALMIC EXAM:  Base Eye Exam     Visual Acuity (ETDRS)       Right Left   Dist cc 20/40 -1 20/30 +2   Dist ph cc 20/25 -1   Pt states left eye is double when looking at chart and right eye is covered.        Tonometry (Tonopen, 3:31 PM)       Right Left   Pressure 12 12         Pupils       Pupils Dark Light APD   Right PERRL 4 3 None   Left PERRL 4 3 None         Extraocular Movement       Right Left    Full Full         Neuro/Psych     Oriented x3: Yes    Mood/Affect: Normal         Dilation     Left eye: 1.0% Mydriacyl, 2.5% Phenylephrine @ 3:31 PM           Slit Lamp and Fundus Exam     External Exam       Right Left   External Normal Normal         Slit Lamp Exam       Right Left   Lids/Lashes Normal Normal   Conjunctiva/Sclera White and quiet White and quiet   Cornea Clear, no epi defects Clear   Anterior Chamber Deep and quiet,,  no cells,  Deep and quiet   Iris Round and reactive Round and reactive   Lens Centered posterior chamber intraocular lens Centered posterior chamber intraocular lens   Anterior Vitreous Normal Normal         Fundus Exam       Right Left   Posterior Vitreous  Posterior vitreous detachment   Disc  Normal   C/D Ratio  0.2   Macula  Pigmented nodule superonasal to the macula foveal region left eye, smaller ring of atrophy no subretinal fluid, smaller today   Vessels  Normal   Periphery  Normal            IMAGING AND PROCEDURES  Imaging and Procedures for 10/14/21  Intravitreal Injection, Pharmacologic Agent - OS - Left Eye       Time Out 10/14/2021. 3:48 PM. Confirmed correct patient, procedure, site, and patient consented.   Anesthesia Topical anesthesia was used. Anesthetic medications included Lidocaine 4%.   Procedure Preparation included Tobramycin 0.3%, Ofloxacin , 10% betadine to eyelids, 5% betadine to ocular surface. A 30 gauge needle was used.   Injection: 2.5 mg bevacizumab 2.5 MG/0.1ML   Route: Intravitreal, Site: Left Eye   NDC: [phone], Lot: [id number]   Post-op Post injection exam found visual acuity of at least counting fingers. The patient tolerated the procedure well. There were no complications. The patient received written and verbal post procedure care education. Post injection medications included ocuflox.      OCT, Retina - OU - Both Eyes       Right Eye Quality was good. Scan locations included subfoveal. Central Foveal Thickness:  264. Progression has improved. Findings include abnormal foveal contour, intraretinal fluid, disciform scar.   Left Eye Quality was good. Scan locations included subfoveal. Central Foveal  Thickness: 289. Progression has been stable. Findings include abnormal foveal contour, intraretinal fluid, disciform scar, subretinal hyper-reflective material.   Notes Improving macular condition in each eye post recent Avastin OD, and less subretinal fluid.  Today at 2-week postinjection with less subretinal fluid     OS, still active superonasal to the FAZ, repeat examination left eye  for residual intraretinal fluid temporally, currently at  6 weeks week interval, improved and stable, and follow-up OS, with much less CNVM activity superonasal.             ASSESSMENT/PLAN:  Exudative age-related macular degeneration of left eye with active choroidal neovascularization (HCC) CNVM activity OS much less at current 6-week interval.  Repeat injection today and examination next again in 7 weeks  Exudative age-related macular degeneration of right eye with active choroidal neovascularization (HCC) Much improved OD, currently 2 weeks post most recent injection follow-up as scheduled     ICD-10-CM   1. Exudative age-related macular degeneration of left eye with active choroidal neovascularization (HCC)  H35.3221 Intravitreal Injection, Pharmacologic Agent - OS - Left Eye    OCT, Retina - OU - Both Eyes    bevacizumab (AVASTIN) SOSY 2.5 mg    2. Exudative age-related macular degeneration of right eye with active choroidal neovascularization (HCC)  H35.3211       1.  OS, overall vastly improved extra foveal, CNVM superonasally.  Currently at 6-week interval.  Repeat again today and examination next in 7 weeks  2.  OD dilate follow-up as scheduled  3.  Ophthalmic Meds Ordered this visit:  Meds ordered this encounter  Medications   bevacizumab (AVASTIN) SOSY 2.5 mg       Return in about 7  weeks (around 12/02/2021) for OS, dilate, AVASTIN OCT.  There are no Patient Instructions on file for this visit.   Explained the diagnoses, plan, and follow up with the patient and they expressed understanding.  Patient expressed understanding of the importance of proper follow up care.   Alford Highland Alycen Mack M.D. Diseases & Surgery of the Retina and Vitreous [facility] 10/14/21     Abbreviations: M myopia (nearsighted); A astigmatism; H hyperopia (farsighted); P presbyopia; Mrx spectacle prescription;  CTL contact lenses; OD right eye; OS left eye; OU both eyes  XT exotropia; ET esotropia; PEK punctate epithelial keratitis; PEE punctate epithelial erosions; DES dry eye syndrome; MGD meibomian gland dysfunction; ATs artificial tears; PFAT's preservative free artificial tears; NSC nuclear sclerotic cataract; PSC posterior subcapsular cataract; ERM epi-retinal membrane; PVD posterior vitreous detachment; RD retinal detachment; DM diabetes mellitus; DR diabetic retinopathy; NPDR non-proliferative diabetic retinopathy; PDR proliferative diabetic retinopathy; CSME clinically significant macular edema; DME diabetic macular edema; dbh dot blot hemorrhages; CWS cotton wool spot; POAG primary open angle glaucoma; C/D cup-to-disc ratio; HVF humphrey visual field; GVF goldmann visual field; OCT optical coherence tomography; IOP intraocular pressure; BRVO Branch retinal vein occlusion; CRVO central retinal vein occlusion; CRAO central retinal artery occlusion; BRAO branch retinal artery occlusion; RT retinal tear; SB scleral buckle; PPV pars plana vitrectomy; VH Vitreous hemorrhage; PRP panretinal laser photocoagulation; IVK intravitreal kenalog; VMT vitreomacular traction; MH Macular hole;  NVD neovascularization of the disc; NVE neovascularization elsewhere; AREDS age related eye disease study; ARMD age related macular degeneration; POAG primary open angle glaucoma; EBMD epithelial/anterior  basement membrane dystrophy; ACIOL anterior chamber intraocular lens; IOL intraocular lens; PCIOL posterior chamber intraocular lens; Phaco/IOL phacoemulsification with intraocular lens placement; PRK photorefractive keratectomy; LASIK laser assisted in situ keratomileusis; HTN  hypertension; DM diabetes mellitus; COPD chronic obstructive pulmonary disease

## 2021-10-14 NOTE — Assessment & Plan Note (Signed)
CNVM activity OS much less at current 6-week interval.  Repeat injection today and examination next again in 7 weeks

## 2021-11-11 ENCOUNTER — Encounter (INDEPENDENT_AMBULATORY_CARE_PROVIDER_SITE_OTHER): Payer: Self-pay | Admitting: Ophthalmology

## 2021-11-11 ENCOUNTER — Encounter (INDEPENDENT_AMBULATORY_CARE_PROVIDER_SITE_OTHER): Payer: Medicare Other | Admitting: Ophthalmology

## 2021-11-11 ENCOUNTER — Ambulatory Visit (INDEPENDENT_AMBULATORY_CARE_PROVIDER_SITE_OTHER): Payer: Medicare Other | Admitting: Ophthalmology

## 2021-11-11 ENCOUNTER — Other Ambulatory Visit: Payer: Self-pay

## 2021-11-11 DIAGNOSIS — H353211 Exudative age-related macular degeneration, right eye, with active choroidal neovascularization: Secondary | ICD-10-CM

## 2021-11-11 DIAGNOSIS — H353221 Exudative age-related macular degeneration, left eye, with active choroidal neovascularization: Secondary | ICD-10-CM | POA: Diagnosis not present

## 2021-11-11 MED ORDER — BEVACIZUMAB 2.5 MG/0.1ML IZ SOSY
2.5000 mg | PREFILLED_SYRINGE | INTRAVITREAL | Status: AC | PRN
Start: 2021-11-11 — End: 2021-11-11
  Administered 2021-11-11: 15:00:00 2.5 mg via INTRAVITREAL

## 2021-11-11 NOTE — Assessment & Plan Note (Signed)
OD vastly improved now on current interval of therapy 6 weeks.  We will repeat injection today And reevaluate 7 weeks OD

## 2021-11-11 NOTE — Progress Notes (Signed)
11/11/2021     CHIEF COMPLAINT Patient presents for  Chief Complaint  Patient presents with   Retina Follow Up      HISTORY OF PRESENT ILLNESS: Victor Walker is a 65 y.o. male who presents to the clinic today for:   HPI     Retina Follow Up           Diagnosis: Wet AMD   Laterality: left eye   Onset: 6 weeks ago   Duration: 6 weeks   Course: gradually worsening         Comments   6 week fu OD oct avastin OD. Pt states vision has improved a little, double vision has "gone down a little" and wiggly vision has "gone down a little." Denies new FOL or floaters. Pt is not using any prescription eye drops.      Last edited by Nelva Nay on 11/11/2021  3:06 PM.      Referring physician: [name], MD [address] [place],  [place] [address]  HISTORICAL INFORMATION:   Selected notes from the MEDICAL RECORD NUMBER       CURRENT MEDICATIONS: Current Outpatient Medications (Ophthalmic Drugs)  Medication Sig   ofloxacin (OCUFLOX) 0.3 % ophthalmic solution  (Patient not taking: No sig reported)   prednisoLONE acetate (PRED FORTE) 1 % ophthalmic suspension SMARTSIG:In Eye(s) (Patient not taking: No sig reported)   No current facility-administered medications for this visit. (Ophthalmic Drugs)   Current Outpatient Medications (Other)  Medication Sig   atorvastatin (LIPITOR) 20 MG tablet Take 1 tablet by mouth daily.   clopidogrel (PLAVIX) 75 MG tablet Take 1 tablet (75 mg total) by mouth daily. (Patient not taking: Reported on 01/22/2021)   metoprolol tartrate (LOPRESSOR) 25 MG tablet Take 1 tablet (25 mg total) by mouth 2 (two) times daily.   nitroGLYCERIN (NITROSTAT) 0.4 MG SL tablet Place 1 tablet (0.4 mg total) under the tongue every 5 (five) minutes as needed for chest pain.   terbinafine (LAMISIL) 250 MG tablet Take 1 tablet (250 mg total) by mouth daily.   traMADol (ULTRAM) 50 MG tablet Take 1-2 tablets by mouth as  needed.   No current facility-administered medications for this visit. (Other)      REVIEW OF SYSTEMS:    ALLERGIES Allergies  Allergen Reactions   Aspirin Anaphylaxis   Penicillins Anaphylaxis    Has patient had a PCN reaction causing immediate rash, facial/tongue/throat swelling, SOB or lightheadedness with hypotension:YES Has patient had a PCN reaction causing severe rash involving mucus membranes or skin necrosis: NO Has patient had a PCN reaction that required hospitalization NO Has patient had a PCN reaction occurring within the last 10 years:NO If all of the above answers are "NO", then may proceed with Cephalosporin use.   Shrimp [Shellfish Allergy] Anaphylaxis    PAST MEDICAL HISTORY Past Medical History:  Diagnosis Date   Corneal abrasion, right, initial encounter 02/20/2021   Patient awakened this morning with a foreign body sensation in his eye.   H/O bilateral inguinal hernia repair    TB (pulmonary tuberculosis)    Past Surgical History:  Procedure Laterality Date   CATARACT EXTRACTION Right 01/29/2021   Dr. [name]   CATARACT EXTRACTION Left 02/05/2021   Dr. [name]   HERNIA REPAIR     KIDNEY STONE SURGERY      FAMILY HISTORY Family History  Problem Relation Age of Onset   Diabetes Mother    Hypertension Mother  SOCIAL HISTORY Social History   Tobacco Use   Smoking status: Every Day   Smokeless tobacco: Never  Vaping Use   Vaping Use: Never used  Substance Use Topics   Alcohol use: Yes   Drug use: Yes    Types: Marijuana         OPHTHALMIC EXAM:  Base Eye Exam     Visual Acuity (ETDRS)       Right Left   Dist [place] 20/25 -1 20/30 -2         Tonometry (Tonopen, 3:09 PM)       Right Left   Pressure 12 13         Pupils       Pupils Dark Light APD   Right PERRL 3 2 None   Left PERRL 3 2 None         Extraocular Movement       Right Left    Full Full         Neuro/Psych     Oriented x3: Yes    Mood/Affect: Normal         Dilation     Both eyes: 1.0% Mydriacyl, 2.5% Phenylephrine @ 3:09 PM           Slit Lamp and Fundus Exam     External Exam       Right Left   External Normal Normal         Slit Lamp Exam       Right Left   Lids/Lashes Normal Normal   Conjunctiva/Sclera White and quiet White and quiet   Cornea Clear, no epi defects Clear   Anterior Chamber Deep and quiet,,  no cells,  Deep and quiet   Iris Round and reactive Round and reactive   Lens Centered posterior chamber intraocular lens Centered posterior chamber intraocular lens   Anterior Vitreous Normal Normal         Fundus Exam       Right Left   Posterior Vitreous Posterior vitreous detachment    Disc Normal    C/D Ratio 0.2 0.2   Macula Less macular thickening, Hard drusen, Retinal pigment epithelial mottling, Retinal pigment epithelial detachment, Subretinal neovascular membrane much less active    Vessels Normal    Periphery Normal             IMAGING AND PROCEDURES  Imaging and Procedures for 11/11/21  Intravitreal Injection, Pharmacologic Agent - OD - Right Eye       Time Out 11/11/2021. 3:23 PM. Confirmed correct patient, procedure, site, and patient consented.   Anesthesia Topical anesthesia was used. Anesthetic medications included Lidocaine 4%.   Procedure Preparation included 5% betadine to ocular surface, 10% betadine to eyelids, Tobramycin 0.3%. A 30 gauge needle was used.   Injection: 2.5 mg bevacizumab 2.5 MG/0.1ML   Route: Intravitreal, Site: Right Eye   NDC: [phone], Lot: [id number]   Post-op Post injection exam found visual acuity of at least counting fingers. The patient tolerated the procedure well. There were no complications. The patient received written and verbal post procedure care education. Post injection medications included ocuflox.      OCT, Retina - OU - Both Eyes       Right Eye Quality was good. Scan locations included  subfoveal. Central Foveal Thickness: 260. Progression has improved. Findings include abnormal foveal contour, intraretinal fluid, disciform scar.   Left Eye Quality was good. Scan locations included subfoveal. Central Foveal Thickness: 284. Progression has improved.  Findings include abnormal foveal contour, intraretinal fluid, disciform scar, subretinal hyper-reflective material.   Notes Improving macular condition in each eye post recent Avastin OD, and less subretinal fluid.  Today at 6-week postinjection with less subretinal fluid   OS, still active superonasal to the FAZ, repeat examination left eye  for residual intraretinal fluid temporally, currently at  4 weeks week interval, improved and stable, and follow-up OS, with much less CNVM activity superonasal.               ASSESSMENT/PLAN:  Exudative age-related macular degeneration of right eye with active choroidal neovascularization (HCC) OD vastly improved now on current interval of therapy 6 weeks.  We will repeat injection today And reevaluate 7 weeks OD     ICD-10-CM   1. Exudative age-related macular degeneration of right eye with active choroidal neovascularization (HCC)  H35.3211 Intravitreal Injection, Pharmacologic Agent - OD - Right Eye    OCT, Retina - OU - Both Eyes    bevacizumab (AVASTIN) SOSY 2.5 mg    2. Exudative age-related macular degeneration of left eye with active choroidal neovascularization (HCC)  H35.3221       1.  OD vastly improved macular findings still with some intraretinal fluid on nasal aspect of the FAZ.  Repeat injection Avastin today reevaluate in 6 to 7 weeks  2.  OS also improved follow-up as scheduled  3.  Ophthalmic Meds Ordered this visit:  Meds ordered this encounter  Medications   bevacizumab (AVASTIN) SOSY 2.5 mg       Return in about 7 weeks (around 12/30/2021) for dilate, OD, AVASTIN OCT.  There are no Patient Instructions on file for this visit.   Explained the  diagnoses, plan, and follow up with the patient and they expressed understanding.  Patient expressed understanding of the importance of proper follow up care.   Alford Highland Graylin Sperling M.D. Diseases & Surgery of the Retina and Vitreous [facility] 11/11/21     Abbreviations: M myopia (nearsighted); A astigmatism; H hyperopia (farsighted); P presbyopia; Mrx spectacle prescription;  CTL contact lenses; OD right eye; OS left eye; OU both eyes  XT exotropia; ET esotropia; PEK punctate epithelial keratitis; PEE punctate epithelial erosions; DES dry eye syndrome; MGD meibomian gland dysfunction; ATs artificial tears; PFAT's preservative free artificial tears; NSC nuclear sclerotic cataract; PSC posterior subcapsular cataract; ERM epi-retinal membrane; PVD posterior vitreous detachment; RD retinal detachment; DM diabetes mellitus; DR diabetic retinopathy; NPDR non-proliferative diabetic retinopathy; PDR proliferative diabetic retinopathy; CSME clinically significant macular edema; DME diabetic macular edema; dbh dot blot hemorrhages; CWS cotton wool spot; POAG primary open angle glaucoma; C/D cup-to-disc ratio; HVF humphrey visual field; GVF goldmann visual field; OCT optical coherence tomography; IOP intraocular pressure; BRVO Branch retinal vein occlusion; CRVO central retinal vein occlusion; CRAO central retinal artery occlusion; BRAO branch retinal artery occlusion; RT retinal tear; SB scleral buckle; PPV pars plana vitrectomy; VH Vitreous hemorrhage; PRP panretinal laser photocoagulation; IVK intravitreal kenalog; VMT vitreomacular traction; MH Macular hole;  NVD neovascularization of the disc; NVE neovascularization elsewhere; AREDS age related eye disease study; ARMD age related macular degeneration; POAG primary open angle glaucoma; EBMD epithelial/anterior basement membrane dystrophy; ACIOL anterior chamber intraocular lens; IOL intraocular lens; PCIOL posterior chamber intraocular lens;  Phaco/IOL phacoemulsification with intraocular lens placement; PRK photorefractive keratectomy; LASIK laser assisted in situ keratomileusis; HTN hypertension; DM diabetes mellitus; COPD chronic obstructive pulmonary disease

## 2021-12-02 ENCOUNTER — Other Ambulatory Visit: Payer: Self-pay

## 2021-12-02 ENCOUNTER — Ambulatory Visit (INDEPENDENT_AMBULATORY_CARE_PROVIDER_SITE_OTHER): Payer: Medicare Other | Admitting: Ophthalmology

## 2021-12-02 ENCOUNTER — Encounter (INDEPENDENT_AMBULATORY_CARE_PROVIDER_SITE_OTHER): Payer: Self-pay | Admitting: Ophthalmology

## 2021-12-02 DIAGNOSIS — H353211 Exudative age-related macular degeneration, right eye, with active choroidal neovascularization: Secondary | ICD-10-CM

## 2021-12-02 DIAGNOSIS — H353221 Exudative age-related macular degeneration, left eye, with active choroidal neovascularization: Secondary | ICD-10-CM | POA: Diagnosis not present

## 2021-12-02 MED ORDER — BEVACIZUMAB 2.5 MG/0.1ML IZ SOSY
2.5000 mg | PREFILLED_SYRINGE | INTRAVITREAL | Status: AC | PRN
  Administered 2021-12-02: 2.5 mg via INTRAVITREAL

## 2021-12-02 NOTE — Assessment & Plan Note (Signed)
OD follow-up as scheduled 

## 2021-12-02 NOTE — Assessment & Plan Note (Signed)
OS, vastly improved macular findings today region superonasal to FAZ at 7-week follow-up interval.  Repeat injection today next examination in 8 weeks

## 2021-12-02 NOTE — Progress Notes (Signed)
12/02/2021     CHIEF COMPLAINT Patient presents for  Chief Complaint  Patient presents with   Retina Follow Up   Follow-up wet ARMD OS, and region of disease activity superonasal to FAZ in the past   HISTORY OF PRESENT ILLNESS: Victor Walker is a 66 y.o. male who presents to the clinic today for:   HPI     Retina Follow Up           Diagnosis: Wet AMD   Laterality: left eye   Onset: 7 weeks ago   Severity: mild   Duration: 7 weeks   Course: stable         Comments   7 week fu OS and OCT and Avastin OS- Exudative ARMD - Last injection 10/14/21  Pt states VA OU stable since last visit. Pt denies FOL, floaters, or ocular pain OU.  Pt states, "I think I have a little less double vision and that curve is not at pronounced as it was."        Last edited by [name], COA on 12/02/2021  2:08 PM.      Referring physician: [name], MD [address] [place],  [place] [address]  HISTORICAL INFORMATION:   Selected notes from the MEDICAL RECORD NUMBER       CURRENT MEDICATIONS: Current Outpatient Medications (Ophthalmic Drugs)  Medication Sig   ofloxacin (OCUFLOX) 0.3 % ophthalmic solution  (Patient not taking: No sig reported)   prednisoLONE acetate (PRED FORTE) 1 % ophthalmic suspension SMARTSIG:In Eye(s) (Patient not taking: No sig reported)   No current facility-administered medications for this visit. (Ophthalmic Drugs)   Current Outpatient Medications (Other)  Medication Sig   atorvastatin (LIPITOR) 20 MG tablet Take 1 tablet by mouth daily.   clopidogrel (PLAVIX) 75 MG tablet Take 1 tablet (75 mg total) by mouth daily. (Patient not taking: Reported on 01/22/2021)   metoprolol tartrate (LOPRESSOR) 25 MG tablet Take 1 tablet (25 mg total) by mouth 2 (two) times daily.   nitroGLYCERIN (NITROSTAT) 0.4 MG SL tablet Place 1 tablet (0.4 mg total) under the tongue every 5 (five) minutes as needed for chest pain.   terbinafine  (LAMISIL) 250 MG tablet Take 1 tablet (250 mg total) by mouth daily.   traMADol (ULTRAM) 50 MG tablet Take 1-2 tablets by mouth as needed.   No current facility-administered medications for this visit. (Other)      REVIEW OF SYSTEMS:    ALLERGIES Allergies  Allergen Reactions   Aspirin Anaphylaxis   Penicillins Anaphylaxis    Has patient had a PCN reaction causing immediate rash, facial/tongue/throat swelling, SOB or lightheadedness with hypotension:YES Has patient had a PCN reaction causing severe rash involving mucus membranes or skin necrosis: NO Has patient had a PCN reaction that required hospitalization NO Has patient had a PCN reaction occurring within the last 10 years:NO If all of the above answers are "NO", then may proceed with Cephalosporin use.   Shrimp [Shellfish Allergy] Anaphylaxis    PAST MEDICAL HISTORY Past Medical History:  Diagnosis Date   Corneal abrasion, right, initial encounter 02/20/2021   Patient awakened this morning with a foreign body sensation in his eye.   H/O bilateral inguinal hernia repair    TB (pulmonary tuberculosis)    Past Surgical History:  Procedure Laterality Date   CATARACT EXTRACTION Right 01/29/2021   Dr. [name]   CATARACT EXTRACTION Left 02/05/2021   Dr. [name]   HERNIA REPAIR  KIDNEY STONE SURGERY      FAMILY HISTORY Family History  Problem Relation Age of Onset   Diabetes Mother    Hypertension Mother     SOCIAL HISTORY Social History   Tobacco Use   Smoking status: Every Day   Smokeless tobacco: Never  Vaping Use   Vaping Use: Never used  Substance Use Topics   Alcohol use: Yes   Drug use: Yes    Types: Marijuana         OPHTHALMIC EXAM:  Base Eye Exam     Visual Acuity (ETDRS)       Right Left   Dist [place] 20/30 -1 20/25 +1         Tonometry (Tonopen, 2:08 PM)       Right Left   Pressure 10 07         Pupils       Pupils React APD   Right PERRL Brisk None   Left PERRL Brisk  None         Visual Fields (Counting fingers)       Left Right    Full Full         Extraocular Movement       Right Left    Full, Ortho Full, Ortho         Neuro/Psych     Oriented x3: Yes   Mood/Affect: Normal         Dilation     Left eye: 1.0% Mydriacyl, 2.5% Phenylephrine @ 2:08 PM           Slit Lamp and Fundus Exam     External Exam       Right Left   External Normal Normal         Slit Lamp Exam       Right Left   Lids/Lashes Normal Normal   Conjunctiva/Sclera White and quiet White and quiet   Cornea Clear, no epi defects Clear   Anterior Chamber Deep and quiet,,  no cells,  Deep and quiet   Iris Round and reactive Round and reactive   Lens Centered posterior chamber intraocular lens Centered posterior chamber intraocular lens   Anterior Vitreous Normal Normal         Fundus Exam       Right Left   Posterior Vitreous  Posterior vitreous detachment   Disc  Normal   C/D Ratio  0.2   Macula  Pigmented nodule superonasal to the macula foveal region left eye, smaller ring of atrophy no subretinal fluid, smaller today   Vessels  Normal   Periphery  Normal            IMAGING AND PROCEDURES  Imaging and Procedures for 12/02/21  Intravitreal Injection, Pharmacologic Agent - OS - Left Eye       Time Out 12/02/2021. 2:53 PM. Confirmed correct patient, procedure, site, and patient consented.   Anesthesia Topical anesthesia was used. Anesthetic medications included Lidocaine 4%.   Procedure Preparation included Tobramycin 0.3%, Ofloxacin , 10% betadine to eyelids, 5% betadine to ocular surface. A 30 gauge needle was used.   Injection: 2.5 mg bevacizumab 2.5 MG/0.1ML   Route: Intravitreal, Site: Left Eye   NDC: [phone], Lot: [id number]   Post-op Post injection exam found visual acuity of at least counting fingers. The patient tolerated the procedure well. There were no complications. The patient received written and verbal  post procedure care education. Post injection medications included ocuflox.  OCT, Retina - OU - Both Eyes       Right Eye Quality was good. Scan locations included subfoveal. Central Foveal Thickness: 256. Progression has improved. Findings include abnormal foveal contour, intraretinal fluid, disciform scar.   Left Eye Quality was good. Scan locations included subfoveal. Central Foveal Thickness: 284. Progression has improved. Findings include abnormal foveal contour, intraretinal fluid, disciform scar, subretinal hyper-reflective material.   Notes Improving macular condition in each eye post recent Avastin OD, and less subretinal fluid.  Today at 6-week postinjection with less subretinal fluid   OS, still active superonasal to the FAZ, repeat examination left eye  for residual intraretinal fluid temporally, currently at  4 weeks week interval, improved and stable, and follow-up OS, with much less CNVM activity superonasal.               ASSESSMENT/PLAN:  Exudative age-related macular degeneration of right eye with active choroidal neovascularization (HCC) OD follow-up as scheduled  Exudative age-related macular degeneration of left eye with active choroidal neovascularization (HCC) OS, vastly improved macular findings today region superonasal to FAZ at 7-week follow-up interval.  Repeat injection today next examination in 8 weeks     ICD-10-CM   1. Exudative age-related macular degeneration of left eye with active choroidal neovascularization (HCC)  H35.3221 Intravitreal Injection, Pharmacologic Agent - OS - Left Eye    OCT, Retina - OU - Both Eyes    bevacizumab (AVASTIN) SOSY 2.5 mg    2. Exudative age-related macular degeneration of right eye with active choroidal neovascularization (HCC)  H35.3211       1.  OS looks great, vastly improved today repeat injection today OS at 7-week and extend interval next 8 weeks  2.  OD follow-up as  scheduled  3.  Ophthalmic Meds Ordered this visit:  Meds ordered this encounter  Medications   bevacizumab (AVASTIN) SOSY 2.5 mg       Return in about 8 weeks (around 01/27/2022) for OS, AVASTIN OCT.  There are no Patient Instructions on file for this visit.   Explained the diagnoses, plan, and follow up with the patient and they expressed understanding.  Patient expressed understanding of the importance of proper follow up care.   Alford Highland Rahaf Carbonell M.D. Diseases & Surgery of the Retina and Vitreous [facility] 12/02/21     Abbreviations: M myopia (nearsighted); A astigmatism; H hyperopia (farsighted); P presbyopia; Mrx spectacle prescription;  CTL contact lenses; OD right eye; OS left eye; OU both eyes  XT exotropia; ET esotropia; PEK punctate epithelial keratitis; PEE punctate epithelial erosions; DES dry eye syndrome; MGD meibomian gland dysfunction; ATs artificial tears; PFAT's preservative free artificial tears; NSC nuclear sclerotic cataract; PSC posterior subcapsular cataract; ERM epi-retinal membrane; PVD posterior vitreous detachment; RD retinal detachment; DM diabetes mellitus; DR diabetic retinopathy; NPDR non-proliferative diabetic retinopathy; PDR proliferative diabetic retinopathy; CSME clinically significant macular edema; DME diabetic macular edema; dbh dot blot hemorrhages; CWS cotton wool spot; POAG primary open angle glaucoma; C/D cup-to-disc ratio; HVF humphrey visual field; GVF goldmann visual field; OCT optical coherence tomography; IOP intraocular pressure; BRVO Branch retinal vein occlusion; CRVO central retinal vein occlusion; CRAO central retinal artery occlusion; BRAO branch retinal artery occlusion; RT retinal tear; SB scleral buckle; PPV pars plana vitrectomy; VH Vitreous hemorrhage; PRP panretinal laser photocoagulation; IVK intravitreal kenalog; VMT vitreomacular traction; MH Macular hole;  NVD neovascularization of the disc; NVE  neovascularization elsewhere; AREDS age related eye disease study; ARMD age related macular degeneration; POAG primary  open angle glaucoma; EBMD epithelial/anterior basement membrane dystrophy; ACIOL anterior chamber intraocular lens; IOL intraocular lens; PCIOL posterior chamber intraocular lens; Phaco/IOL phacoemulsification with intraocular lens placement; PRK photorefractive keratectomy; LASIK laser assisted in situ keratomileusis; HTN hypertension; DM diabetes mellitus; COPD chronic obstructive pulmonary disease

## 2021-12-12 ENCOUNTER — Other Ambulatory Visit: Payer: Self-pay

## 2021-12-12 ENCOUNTER — Ambulatory Visit (HOSPITAL_COMMUNITY)
Admission: EM | Admit: 2021-12-12 | Discharge: 2021-12-12 | Discharge status: Against Medical Advice | Payer: Medicare Other | Attending: Family Medicine | Admitting: Family Medicine

## 2021-12-30 ENCOUNTER — Encounter (INDEPENDENT_AMBULATORY_CARE_PROVIDER_SITE_OTHER): Payer: Medicare Other | Admitting: Ophthalmology

## 2022-01-01 ENCOUNTER — Other Ambulatory Visit: Payer: Self-pay

## 2022-01-01 ENCOUNTER — Encounter (INDEPENDENT_AMBULATORY_CARE_PROVIDER_SITE_OTHER): Payer: Self-pay | Admitting: Ophthalmology

## 2022-01-01 ENCOUNTER — Ambulatory Visit (INDEPENDENT_AMBULATORY_CARE_PROVIDER_SITE_OTHER): Payer: Medicare Other | Admitting: Ophthalmology

## 2022-01-01 DIAGNOSIS — H353211 Exudative age-related macular degeneration, right eye, with active choroidal neovascularization: Secondary | ICD-10-CM

## 2022-01-01 DIAGNOSIS — H353221 Exudative age-related macular degeneration, left eye, with active choroidal neovascularization: Secondary | ICD-10-CM | POA: Diagnosis not present

## 2022-01-01 MED ORDER — BEVACIZUMAB 2.5 MG/0.1ML IZ SOSY
2.5000 mg | PREFILLED_SYRINGE | INTRAVITREAL | Status: AC | PRN
  Administered 2022-01-01: 2.5 mg via INTRAVITREAL

## 2022-01-01 NOTE — Assessment & Plan Note (Signed)
OD looks great, vastly improved.  Okay now to have repeat injection Avastin at 7-week interval  Pain May proceed also to have final refraction under the care of Dr. [name]

## 2022-01-01 NOTE — Progress Notes (Signed)
01/01/2022     CHIEF COMPLAINT Patient presents for  Chief Complaint  Patient presents with   Macular Degeneration      HISTORY OF PRESENT ILLNESS: Victor Walker is a 66 y.o. male who presents to the clinic today for:   HPI   7 weeks dilate OD, Avastin OCT. Pt states "the double vision comes and goes, I mostly notice that from the left eye. The waviness in the right eye has improved, its almost not there anymore. I need glasses for up close I can hardly see a nail to hammer a nail or use my screwdriver." Pt states occasional flashes at night when driving and he turns his head he notices them but that is longstanding. Denies new FOL or floaters. Pt states he is not using any prescription eye drops, finished the drops that are on the medication list and should show discontinued, per patient.   Last edited by Nelva Nay on 01/01/2022  3:55 PM.      Referring physician: Jethro Bolus, MD [address] [place],  [place] [address]  HISTORICAL INFORMATION:   Selected notes from the MEDICAL RECORD NUMBER       CURRENT MEDICATIONS: Current Outpatient Medications (Ophthalmic Drugs)  Medication Sig   ofloxacin (OCUFLOX) 0.3 % ophthalmic solution  (Patient not taking: No sig reported)   prednisoLONE acetate (PRED FORTE) 1 % ophthalmic suspension SMARTSIG:In Eye(s) (Patient not taking: No sig reported)   No current facility-administered medications for this visit. (Ophthalmic Drugs)   Current Outpatient Medications (Other)  Medication Sig   atorvastatin (LIPITOR) 20 MG tablet Take 1 tablet by mouth daily.   clopidogrel (PLAVIX) 75 MG tablet Take 1 tablet (75 mg total) by mouth daily. (Patient not taking: Reported on 01/22/2021)   metoprolol tartrate (LOPRESSOR) 25 MG tablet Take 1 tablet (25 mg total) by mouth 2 (two) times daily.   nitroGLYCERIN (NITROSTAT) 0.4 MG SL tablet Place 1 tablet (0.4 mg total) under the tongue every 5 (five) minutes as needed for chest pain.    terbinafine (LAMISIL) 250 MG tablet Take 1 tablet (250 mg total) by mouth daily.   traMADol (ULTRAM) 50 MG tablet Take 1-2 tablets by mouth as needed.   No current facility-administered medications for this visit. (Other)      REVIEW OF SYSTEMS:    ALLERGIES Allergies  Allergen Reactions   Aspirin Anaphylaxis   Penicillins Anaphylaxis    Has patient had a PCN reaction causing immediate rash, facial/tongue/throat swelling, SOB or lightheadedness with hypotension:YES Has patient had a PCN reaction causing severe rash involving mucus membranes or skin necrosis: NO Has patient had a PCN reaction that required hospitalization NO Has patient had a PCN reaction occurring within the last 10 years:NO If all of the above answers are "NO", then may proceed with Cephalosporin use.   Shrimp [Shellfish Allergy] Anaphylaxis    PAST MEDICAL HISTORY Past Medical History:  Diagnosis Date   Corneal abrasion, right, initial encounter 02/20/2021   Patient awakened this morning with a foreign body sensation in his eye.   H/O bilateral inguinal hernia repair    TB (pulmonary tuberculosis)    Past Surgical History:  Procedure Laterality Date   CATARACT EXTRACTION Right 01/29/2021   Dr. [name]   CATARACT EXTRACTION Left 02/05/2021   Dr. [name]   HERNIA REPAIR     KIDNEY STONE SURGERY      FAMILY HISTORY Family History  Problem Relation Age of Onset   Diabetes Mother  Hypertension Mother     SOCIAL HISTORY Social History   Tobacco Use   Smoking status: Every Day   Smokeless tobacco: Never  Vaping Use   Vaping Use: Never used  Substance Use Topics   Alcohol use: Yes   Drug use: Yes    Types: Marijuana         OPHTHALMIC EXAM:  Base Eye Exam     Visual Acuity (ETDRS)       Right Left   Dist [place] 20/30 -1 20/30 +2         Tonometry (Tonopen, 3:57 PM)       Right Left   Pressure 14 11         Pupils       Pupils Dark Light APD   Right PERRL 3 2 None    Left PERRL 3 2 None         Extraocular Movement       Right Left    Full Full         Neuro/Psych     Oriented x3: Yes   Mood/Affect: Normal         Dilation     Right eye: 1.0% Mydriacyl, 2.5% Phenylephrine @ 3:57 PM           Slit Lamp and Fundus Exam     External Exam       Right Left   External Normal Normal         Slit Lamp Exam       Right Left   Lids/Lashes Normal Normal   Conjunctiva/Sclera White and quiet White and quiet   Cornea Clear, no epi defects Clear   Anterior Chamber Deep and quiet,,  no cells,  Deep and quiet   Iris Round and reactive Round and reactive   Lens Centered posterior chamber intraocular lens Centered posterior chamber intraocular lens   Anterior Vitreous Normal Normal         Fundus Exam       Right Left   Posterior Vitreous Posterior vitreous detachment    Disc Normal    C/D Ratio 0.2 0.2   Macula Less macular thickening, Hard drusen, Retinal pigment epithelial mottling, Retinal pigment epithelial detachment, Subretinal neovascular membrane much less active    Vessels Normal    Periphery Normal             IMAGING AND PROCEDURES  Imaging and Procedures for 01/01/22  OCT, Retina - OU - Both Eyes       Right Eye Quality was good. Scan locations included subfoveal. Central Foveal Thickness: 255. Progression has improved. Findings include abnormal foveal contour, intraretinal fluid, disciform scar.   Left Eye Quality was good. Scan locations included subfoveal. Central Foveal Thickness: 280. Progression has improved. Findings include abnormal foveal contour, intraretinal fluid, disciform scar, subretinal hyper-reflective material.   Notes Improving macular condition in each eye post recent Avastin OD, and less subretinal fluid.  Today at 7-week postinjection with less subretinal fluid, repeat injection OD TODAY    OS, still active superonasal to the FAZ, repeat examination left eye  for residual  intraretinal fluid temporally, currently at  4 weeks week interval, improved and stable, and follow-up OS, with much less CNVM activity superonasal.  F/U AS SCHEDULED       Intravitreal Injection, Pharmacologic Agent - OD - Right Eye       Time Out 01/01/2022. 4:12 PM. Confirmed correct patient, procedure, site, and patient consented.  Anesthesia Topical anesthesia was used. Anesthetic medications included Lidocaine 4%.   Procedure Preparation included 5% betadine to ocular surface, 10% betadine to eyelids, Tobramycin 0.3%. A 30 gauge needle was used.   Injection: 2.5 mg bevacizumab 2.5 MG/0.1ML   Route: Intravitreal, Site: Right Eye   NDC: [phone], Lot: [id number]   Post-op Post injection exam found visual acuity of at least counting fingers. The patient tolerated the procedure well. There were no complications. The patient received written and verbal post procedure care education. Post injection medications included ocuflox.              ASSESSMENT/PLAN:  Exudative age-related macular degeneration of right eye with active choroidal neovascularization (HCC) OD looks great, vastly improved.  Okay now to have repeat injection Avastin at 7-week interval  Pain May proceed also to have final refraction under the care of Dr. [name]  Exudative age-related macular degeneration of left eye with active choroidal neovascularization (HCC) Onset of disease OS some 1 year previous, vastly improved follow-up OS as scheduled      ICD-10-CM   1. Exudative age-related macular degeneration of right eye with active choroidal neovascularization (HCC)  H35.3211 OCT, Retina - OU - Both Eyes    Intravitreal Injection, Pharmacologic Agent - OD - Right Eye    bevacizumab (AVASTIN) SOSY 2.5 mg    2. Exudative age-related macular degeneration of left eye with active choroidal neovascularization (HCC)  H35.3221       1.  Repeat injection intravitreal Avastin today at 7-week interval, with  vastly improved macular findings as compared to 11 months prior onset of disease, OD at 7-week interval today.  Stabilized acuity and anatomy.  Okay to proceed with refraction  2.  OS also improving.  Follow-up OS as scheduled  3.  Patient needs return refraction with Dr. 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  There are no Patient Instructions on file for this visit.   Explained the diagnoses, plan, and follow up with the patient and they expressed understanding.  Patient expressed understanding of the importance of proper follow up care.   Alford Highland Sharla Tankard M.D. Diseases & Surgery of the Retina and Vitreous [facility] 01/01/22     Abbreviations: M myopia (nearsighted); A astigmatism; H hyperopia (farsighted); P presbyopia; Mrx spectacle prescription;  CTL contact lenses; OD right eye; OS left eye; OU both eyes  XT exotropia; ET esotropia; PEK punctate epithelial keratitis; PEE punctate epithelial erosions; DES dry eye syndrome; MGD meibomian gland dysfunction; ATs artificial tears; PFAT's preservative free artificial tears; NSC nuclear sclerotic cataract; PSC posterior subcapsular cataract; ERM epi-retinal membrane; PVD posterior vitreous detachment; RD retinal detachment; DM diabetes mellitus; DR diabetic retinopathy; NPDR non-proliferative diabetic retinopathy; PDR proliferative diabetic retinopathy; CSME clinically significant macular edema; DME diabetic macular edema; dbh dot blot hemorrhages; CWS cotton wool spot; POAG primary open angle glaucoma; C/D cup-to-disc ratio; HVF humphrey visual field; GVF goldmann visual field; OCT optical coherence tomography; IOP intraocular pressure; BRVO Branch retinal vein  occlusion; CRVO central retinal vein occlusion; CRAO central retinal artery occlusion; BRAO branch retinal artery occlusion; RT retinal tear; SB scleral buckle; PPV pars plana vitrectomy; VH Vitreous hemorrhage; PRP panretinal laser photocoagulation;  IVK intravitreal kenalog; VMT vitreomacular traction; MH Macular hole;  NVD neovascularization of the disc; NVE neovascularization elsewhere; AREDS age related eye disease study; ARMD age related macular degeneration; POAG primary open angle glaucoma; EBMD epithelial/anterior basement membrane dystrophy; ACIOL anterior chamber intraocular lens; IOL intraocular lens; PCIOL posterior chamber intraocular lens; Phaco/IOL phacoemulsification with intraocular lens placement; [place] photorefractive keratectomy; LASIK laser assisted in situ keratomileusis; HTN hypertension; DM diabetes mellitus; COPD chronic obstructive pulmonary disease

## 2022-01-01 NOTE — Assessment & Plan Note (Signed)
Onset of disease OS some 1 year previous, vastly improved follow-up OS as scheduled

## 2022-01-27 ENCOUNTER — Ambulatory Visit (INDEPENDENT_AMBULATORY_CARE_PROVIDER_SITE_OTHER): Payer: Medicare Other | Admitting: Ophthalmology

## 2022-01-27 ENCOUNTER — Encounter (INDEPENDENT_AMBULATORY_CARE_PROVIDER_SITE_OTHER): Payer: Medicare Other | Admitting: Ophthalmology

## 2022-01-27 ENCOUNTER — Other Ambulatory Visit: Payer: Self-pay

## 2022-01-27 DIAGNOSIS — H353211 Exudative age-related macular degeneration, right eye, with active choroidal neovascularization: Secondary | ICD-10-CM | POA: Diagnosis not present

## 2022-01-27 DIAGNOSIS — H353221 Exudative age-related macular degeneration, left eye, with active choroidal neovascularization: Secondary | ICD-10-CM | POA: Diagnosis not present

## 2022-01-27 MED ORDER — BEVACIZUMAB 2.5 MG/0.1ML IZ SOSY
2.5000 mg | PREFILLED_SYRINGE | INTRAVITREAL | Status: AC | PRN
  Administered 2022-02-04: 2.5 mg via INTRAVITREAL

## 2022-01-27 NOTE — Assessment & Plan Note (Signed)
Today at 8-week follow-up, OS, stabilize lesion condition no involvement of the macula post Avastin.  We will repeat Avastin today and maintain 8-week follow-up ?

## 2022-01-27 NOTE — Progress Notes (Unsigned)
01/27/2022     CHIEF COMPLAINT Patient presents for  Chief Complaint  Patient presents with   Macular Degeneration      HISTORY OF PRESENT ILLNESS: Victor Walker is a 66 y.o. male who presents to the clinic today for:   HPI   8 week fu os oct avastin os Pt states vision is about the same. Pt states, "It looks like a vortex in my right eye.Double vision is still there. Received new glasses from Dr. Dione Booze and I am seeing a lot better. I am  still seeing the typical 2-3 floaters. Better than 100 of floaters back then."   Last edited by Angeline Slim on 01/27/2022  3:38 PM.      Referring physician: [name], MD [address] [place],  [place] [address]  HISTORICAL INFORMATION:   Selected notes from the MEDICAL RECORD NUMBER       CURRENT MEDICATIONS: Current Outpatient Medications (Ophthalmic Drugs)  Medication Sig   ofloxacin (OCUFLOX) 0.3 % ophthalmic solution  (Patient not taking: No sig reported)   prednisoLONE acetate (PRED FORTE) 1 % ophthalmic suspension SMARTSIG:In Eye(s) (Patient not taking: No sig reported)   No current facility-administered medications for this visit. (Ophthalmic Drugs)   Current Outpatient Medications (Other)  Medication Sig   atorvastatin (LIPITOR) 20 MG tablet Take 1 tablet by mouth daily.   clopidogrel (PLAVIX) 75 MG tablet Take 1 tablet (75 mg total) by mouth daily. (Patient not taking: Reported on 01/22/2021)   metoprolol tartrate (LOPRESSOR) 25 MG tablet Take 1 tablet (25 mg total) by mouth 2 (two) times daily.   nitroGLYCERIN (NITROSTAT) 0.4 MG SL tablet Place 1 tablet (0.4 mg total) under the tongue every 5 (five) minutes as needed for chest pain.   terbinafine (LAMISIL) 250 MG tablet Take 1 tablet (250 mg total) by mouth daily.   traMADol (ULTRAM) 50 MG tablet Take 1-2 tablets by mouth as needed.   No current facility-administered medications for this visit. (Other)      REVIEW OF SYSTEMS: ROS    Negative for: Constitutional, Gastrointestinal, Neurological, Skin, Genitourinary, Musculoskeletal, HENT, Endocrine, Cardiovascular, Eyes, Respiratory, Psychiatric, Allergic/Imm, Heme/Lymph Last edited by Angeline Slim on 01/27/2022  3:38 PM.       ALLERGIES Allergies  Allergen Reactions   Aspirin Anaphylaxis   Penicillins Anaphylaxis    Has patient had a PCN reaction causing immediate rash, facial/tongue/throat swelling, SOB or lightheadedness with hypotension:YES Has patient had a PCN reaction causing severe rash involving mucus membranes or skin necrosis: NO Has patient had a PCN reaction that required hospitalization NO Has patient had a PCN reaction occurring within the last 10 years:NO If all of the above answers are "NO", then may proceed with Cephalosporin use.   Shrimp [Shellfish Allergy] Anaphylaxis    PAST MEDICAL HISTORY Past Medical History:  Diagnosis Date   Corneal abrasion, right, initial encounter 02/20/2021   Patient awakened this morning with a foreign body sensation in his eye.   H/O bilateral inguinal hernia repair    TB (pulmonary tuberculosis)    Past Surgical History:  Procedure Laterality Date   CATARACT EXTRACTION Right 01/29/2021   Dr. [name]   CATARACT EXTRACTION Left 02/05/2021   Dr. [name]   HERNIA REPAIR     KIDNEY STONE SURGERY      FAMILY HISTORY Family History  Problem Relation Age of Onset   Diabetes Mother    Hypertension Mother     SOCIAL HISTORY Social History  Tobacco Use   Smoking status: Every Day   Smokeless tobacco: Never  Vaping Use   Vaping Use: Never used  Substance Use Topics   Alcohol use: Yes   Drug use: Yes    Types: Marijuana         OPHTHALMIC EXAM:  Base Eye Exam     Visual Acuity (ETDRS)       Right Left   Dist [place] 20/30 -2 20/40 -1   Dist ph [place] 20/25 -2 20/25         Tonometry (Tonopen, 3:45 PM)       Right Left   Pressure 8 9         Pupils       Pupils Dark Light Shape React APD    Right PERRL 3 2 Round Slow None   Left PERRL 3 2 Round Slow None         Visual Fields       Left Right    Full          Extraocular Movement       Right Left    Full Full         Neuro/Psych     Oriented x3: Yes   Mood/Affect: Normal         Dilation     Both eyes: 1.0% Mydriacyl, 2.5% Phenylephrine @ 3:45 PM           Slit Lamp and Fundus Exam     External Exam       Right Left   External Normal Normal         Slit Lamp Exam       Right Left   Lids/Lashes Normal Normal   Conjunctiva/Sclera White and quiet White and quiet   Cornea Clear, no epi defects Clear   Anterior Chamber Deep and quiet,,  no cells,  Deep and quiet   Iris Round and reactive Round and reactive   Lens Centered posterior chamber intraocular lens Centered posterior chamber intraocular lens   Anterior Vitreous Normal Normal         Fundus Exam       Right Left   Posterior Vitreous  Posterior vitreous detachment   Disc  Normal   C/D Ratio  0.2   Macula  Pigmented nodule superonasal to the macula foveal region left eye, smaller ring of atrophy no subretinal fluid, smaller today   Vessels  Normal   Periphery  Normal            IMAGING AND PROCEDURES  Imaging and Procedures for 01/27/22  OCT, Retina - OU - Both Eyes       Right Eye Quality was good. Scan locations included subfoveal. Central Foveal Thickness: 258. Progression has improved. Findings include abnormal foveal contour, intraretinal fluid, disciform scar.   Left Eye Quality was good. Scan locations included subfoveal. Central Foveal Thickness: 285. Progression has improved. Findings include abnormal foveal contour, intraretinal fluid, disciform scar, subretinal hyper-reflective material.   Notes Improving macular condition in each eye post recent Avastin OD, and less subretinal fluid.  Today at 3-week postinjection with less subretinal fluid, repeat injection OD TODAY    OS, still active  superonasal to the FAZ, repeat examination left eye  for residual intraretinal fluid temporally, currently at  8 weeks week interval, improved and stable, and follow-up OS, with much less CNVM activity superonasal.  F/U AS SCHEDULED  ASSESSMENT/PLAN:  Exudative age-related macular degeneration of left eye with active choroidal neovascularization (HCC) Today at 8-week follow-up, OS, stabilize lesion condition no involvement of the macula post Avastin.  We will repeat Avastin today and maintain 8-week follow-up  Exudative age-related macular degeneration of right eye with active choroidal neovascularization (HCC) Today some 3 weeks postinjection, follow-up OD as     ICD-10-CM   1. Exudative age-related macular degeneration of left eye with active choroidal neovascularization (HCC)  H35.3221 OCT, Retina - OU - Both Eyes    Intravitreal Injection, Pharmacologic Agent - OS - Left Eye    2. Exudative age-related macular degeneration of right eye with active choroidal neovascularization (HCC)  H35.3211       1.  OS today at 8 weeks remained stable on intravitreal Avastin.  2.  OD follow-up as scheduled  3.  Ophthalmic Meds Ordered this visit:  No orders of the defined types were placed in this encounter.      Return in about 8 weeks (around 03/24/2022) for dilate, OS, AVASTIN OCT, and OD as scheduled.  There are no Patient Instructions on file for this visit.   Explained the diagnoses, plan, and follow up with the patient and they expressed understanding.  Patient expressed understanding of the importance of proper follow up care.   Alford Highland Sneha Willig M.D. Diseases & Surgery of the Retina and Vitreous [facility] 01/27/22     Abbreviations: M myopia (nearsighted); A astigmatism; H hyperopia (farsighted); P presbyopia; Mrx spectacle prescription;  CTL contact lenses; OD right eye; OS left eye; OU both eyes  XT exotropia; ET esotropia; PEK  punctate epithelial keratitis; PEE punctate epithelial erosions; DES dry eye syndrome; MGD meibomian gland dysfunction; ATs artificial tears; PFAT's preservative free artificial tears; NSC nuclear sclerotic cataract; PSC posterior subcapsular cataract; ERM epi-retinal membrane; PVD posterior vitreous detachment; RD retinal detachment; DM diabetes mellitus; DR diabetic retinopathy; NPDR non-proliferative diabetic retinopathy; PDR proliferative diabetic retinopathy; CSME clinically significant macular edema; DME diabetic macular edema; dbh dot blot hemorrhages; CWS cotton wool spot; POAG primary open angle glaucoma; C/D cup-to-disc ratio; HVF humphrey visual field; GVF goldmann visual field; OCT optical coherence tomography; IOP intraocular pressure; BRVO Branch retinal vein occlusion; CRVO central retinal vein occlusion; CRAO central retinal artery occlusion; BRAO branch retinal artery occlusion; RT retinal tear; SB scleral buckle; PPV pars plana vitrectomy; VH Vitreous hemorrhage; PRP panretinal laser photocoagulation; IVK intravitreal kenalog; VMT vitreomacular traction; MH Macular hole;  NVD neovascularization of the disc; NVE neovascularization elsewhere; AREDS age related eye disease study; ARMD age related macular degeneration; POAG primary open angle glaucoma; EBMD epithelial/anterior basement membrane dystrophy; ACIOL anterior chamber intraocular lens; IOL intraocular lens; PCIOL posterior chamber intraocular lens; Phaco/IOL phacoemulsification with intraocular lens placement; PRK photorefractive keratectomy; LASIK laser assisted in situ keratomileusis; HTN hypertension; DM diabetes mellitus; COPD chronic obstructive pulmonary disease

## 2022-01-27 NOTE — Assessment & Plan Note (Signed)
Today some 3 weeks postinjection, follow-up OD as ?

## 2022-02-04 ENCOUNTER — Encounter (INDEPENDENT_AMBULATORY_CARE_PROVIDER_SITE_OTHER): Payer: Self-pay | Admitting: Ophthalmology

## 2022-02-14 ENCOUNTER — Emergency Department (HOSPITAL_COMMUNITY)
Admission: EM | Admit: 2022-02-14 | Discharge: 2022-02-15 | Disposition: A | Discharge status: Home / Self Care | Payer: Medicare Other | Attending: Emergency Medicine | Admitting: Emergency Medicine

## 2022-02-14 ENCOUNTER — Other Ambulatory Visit: Payer: Self-pay

## 2022-02-14 DIAGNOSIS — R112 Nausea with vomiting, unspecified: Secondary | ICD-10-CM | POA: Diagnosis not present

## 2022-02-14 DIAGNOSIS — R197 Diarrhea, unspecified: Secondary | ICD-10-CM | POA: Diagnosis not present

## 2022-02-14 DIAGNOSIS — N433 Hydrocele, unspecified: Secondary | ICD-10-CM

## 2022-02-14 DIAGNOSIS — D72829 Elevated white blood cell count, unspecified: Secondary | ICD-10-CM | POA: Diagnosis not present

## 2022-02-14 DIAGNOSIS — R1084 Generalized abdominal pain: Secondary | ICD-10-CM | POA: Insufficient documentation

## 2022-02-14 LAB — CBG MONITORING, ED: Glucose-Capillary: 163 mg/dL — ABNORMAL HIGH (ref 70–99)

## 2022-02-14 MED ORDER — ONDANSETRON HCL 4 MG/2ML IJ SOLN
4.0000 mg | Freq: Once | INTRAMUSCULAR | Status: AC
  Administered 2022-02-14: 4 mg via INTRAVENOUS
  Filled 2022-02-14: qty 2

## 2022-02-14 MED ORDER — SODIUM CHLORIDE 0.9 % IV BOLUS
1000.0000 mL | Freq: Once | INTRAVENOUS | Status: AC
  Administered 2022-02-14: 1000 mL via INTRAVENOUS

## 2022-02-14 NOTE — ED Triage Notes (Signed)
Pt BIB EMS due to having n/v/d that started about an hour ago. Per pt, he thinks he has food poisoning from some scallops he had earlier. Pt is very diaphoretic and has chills.  ?

## 2022-02-14 NOTE — ED Provider Notes (Signed)
?[facility] ?[facility] Emergency Department ?Provider Note ?MRN:  [id number]  ?Arrival date & time: 02/15/22    ? ?Chief Complaint   ?Nausea and Emesis ?  ?History of Present Illness   ?Victor Walker is a 66 y.o. year-old male presents to the ED with chief complaint of nausea, vomiting, and diarrhea.  States that the symptoms started about an hour ago.  He states that he ate scallops tonight and the symptoms started not long after.  He denies any treatments PTA. ? ?History provided by patient. ? ? ?Review of Systems  ?Pertinent review of systems noted in HPI.  ? ? ?Physical Exam  ? ?Vitals:  ? 02/14/22 2325 02/15/22 0030  ?BP: 100/77 112/83  ?Pulse: 86 86  ?Resp: 18 18  ?Temp:    ?SpO2: 96% 96%  ?  ?CONSTITUTIONAL:  nontoxic-appearing, NAD ?NEURO:  Alert and oriented x 3, CN 3-12 grossly intact ?EYES:  eyes equal and reactive ?ENT/NECK:  Supple, no stridor  ?CARDIO:  normal rate, appears well-perfused  ?PULM:  No respiratory distress,  ?GI/GU:  non-distended, no focal abdominal tenderness, generalized discomfort ?MSK/SPINE:  No gross deformities, no edema, moves all extremities  ?SKIN:  no rash, atraumatic ? ? ?*Additional and/or pertinent findings included in MDM below ? ?Diagnostic and Interventional Summary  ? ? ?Labs Reviewed  ?CBC WITH DIFFERENTIAL/PLATELET - Abnormal; Notable for the following components:  ?    Result Value  ? WBC 19.6 (*)   ? RBC 5.99 (*)   ? Hemoglobin 17.3 (*)   ? HCT 52.4 (*)   ? Neutro Abs 16.7 (*)   ? Monocytes Absolute 1.6 (*)   ? All other components within normal limits  ?COMPREHENSIVE METABOLIC PANEL - Abnormal; Notable for the following components:  ? Potassium 3.0 (*)   ? CO2 20 (*)   ? Glucose, Bld 158 (*)   ? BUN 25 (*)   ? Total Protein 9.0 (*)   ? All other components within normal limits  ?LIPASE, BLOOD - Abnormal; Notable for the following components:  ? Lipase 68 (*)   ? All other components within normal limits  ?CBG MONITORING, ED - Abnormal; Notable  for the following components:  ? Glucose-Capillary 163 (*)   ? All other components within normal limits  ?  ?CT ABDOMEN PELVIS W CONTRAST  ?Final Result  ?  ?  ?Medications  ?sodium chloride 0.9 % bolus 1,000 mL (0 mLs Intravenous Stopped 02/15/22 0039)  ?ondansetron [facility]) injection 4 mg (4 mg Intravenous Given 02/14/22 2251)  ?lactated ringers bolus 1,000 mL (1,000 mLs Intravenous New Bag/Given 02/15/22 0121)  ?iohexol (OMNIPAQUE) 300 MG/ML solution 100 mL (100 mLs Intravenous Contrast Given 02/15/22 0116)  ?  ? ?Procedures  /  Critical Care ?Procedures ? ?ED Course and Medical Decision Making  ?I have reviewed the triage vital signs, the nursing notes, and pertinent available records from the EMR. ? ?Complexity of Problems Addressed: ?Moderate Complexity: Acute illness with systemic symptoms, requiring diagnostic workup to rule out more severe disease. ?Comorbidities affecting this illness/injury include: ?None ?Social Determinants Affecting Care: ?No clinically significant social determinants affecting this chief complaint.. ? ? ?ED Course: ?After considering the following differential, food poisoning, viral gastro, pancreatitis, gallbladder, I ordered labs and fluids and treatment with Zofran. ?I personally interpreted the labs which are notable for leukocytosis ?I visualized the CT, which is notable for no free air and agree with radiologist interpretation.. ? ?  ? ?Consultants: ?No consultations were needed in  caring for this patient. ? ?Treatment and Plan: ?CT shows no emergent process.  Patient feeling better after fluids and Zofran.  Will discharge home with Zofran.  Incidental finding on CT of possible hydrocele, will recommend urology follow-up. ? ?Emergency department workup does not suggest an emergent condition requiring admission or immediate intervention beyond  what has been performed at this time. The patient is safe for discharge and has  been instructed to return immediately for worsening  symptoms, change in  symptoms or any other concerns ? ? ? ?Final Clinical Impressions(s) / ED Diagnoses  ? ?  ICD-10-CM   ?1. Nausea vomiting and diarrhea  R11.2   ? R19.7   ?  ?2. Hydrocele, unspecified hydrocele type  N43.3   ?  ?  ?ED Discharge Orders   ? ?      Ordered  ?  ondansetron (ZOFRAN-ODT) 4 MG disintegrating tablet  Every 8 hours PRN       ? 02/15/22 0146  ? ?  ?  ? ?  ?  ? ? ?Discharge Instructions Discussed with and Provided to Patient:  ? ? ? ?Discharge Instructions   ? ?  ?The CT scan had an incidental finding of a probable hydrocele, this is a mass in the testicle that are usually not dangerous.  I recommend that you follow-up with urology for further evaluation. ? ? ? ? ?  ?Roxy Horseman, PA-C ?02/15/22 0150 ? ?  ?Cheryll Cockayne, MD ?02/20/22 1710 ? ?

## 2022-02-15 ENCOUNTER — Emergency Department (HOSPITAL_COMMUNITY): Payer: Medicare Other

## 2022-02-15 ENCOUNTER — Encounter (HOSPITAL_COMMUNITY): Payer: Self-pay

## 2022-02-15 LAB — CBC WITH DIFFERENTIAL/PLATELET
Abs Immature Granulocytes: 0 10*3/uL (ref 0.00–0.07)
Band Neutrophils: 7 %
Basophils Absolute: 0 10*3/uL (ref 0.0–0.1)
Basophils Relative: 0 %
Eosinophils Absolute: 0 10*3/uL (ref 0.0–0.5)
Eosinophils Relative: 0 %
HCT: 52.4 % — ABNORMAL HIGH (ref 39.0–52.0)
Hemoglobin: 17.3 g/dL — ABNORMAL HIGH (ref 13.0–17.0)
Lymphocytes Relative: 7 %
Lymphs Abs: 1.4 10*3/uL (ref 0.7–4.0)
MCH: 28.9 pg (ref 26.0–34.0)
MCHC: 33 g/dL (ref 30.0–36.0)
MCV: 87.5 fL (ref 80.0–100.0)
Monocytes Absolute: 1.6 10*3/uL — ABNORMAL HIGH (ref 0.1–1.0)
Monocytes Relative: 8 %
Neutro Abs: 16.7 10*3/uL — ABNORMAL HIGH (ref 1.7–7.7)
Neutrophils Relative %: 78 %
Platelets: 317 10*3/uL (ref 150–400)
RBC: 5.99 MIL/uL — ABNORMAL HIGH (ref 4.22–5.81)
RDW: 14.3 % (ref 11.5–15.5)
WBC: 19.6 10*3/uL — ABNORMAL HIGH (ref 4.0–10.5)
nRBC: 0 % (ref 0.0–0.2)

## 2022-02-15 LAB — COMPREHENSIVE METABOLIC PANEL
ALT: 15 U/L (ref 0–44)
AST: 15 U/L (ref 15–41)
Albumin: 4.8 g/dL (ref 3.5–5.0)
Alkaline Phosphatase: 100 U/L (ref 38–126)
Anion gap: 14 (ref 5–15)
BUN: 25 mg/dL — ABNORMAL HIGH (ref 8–23)
CO2: 20 mmol/L — ABNORMAL LOW (ref 22–32)
Calcium: 10.1 mg/dL (ref 8.9–10.3)
Chloride: 107 mmol/L (ref 98–111)
Creatinine, Ser: 1.12 mg/dL (ref 0.61–1.24)
GFR, Estimated: >60 mL/min (ref >60)
Glucose, Bld: 158 mg/dL — ABNORMAL HIGH (ref 70–99)
Potassium: 3 mmol/L — ABNORMAL LOW (ref 3.5–5.1)
Sodium: 141 mmol/L (ref 135–145)
Total Bilirubin: 0.5 mg/dL (ref 0.3–1.2)
Total Protein: 9 g/dL — ABNORMAL HIGH (ref 6.5–8.1)

## 2022-02-15 LAB — LIPASE, BLOOD: Lipase: 68 U/L — ABNORMAL HIGH (ref 11–51)

## 2022-02-15 MED ORDER — IOHEXOL 300 MG/ML  SOLN
100.0000 mL | Freq: Once | INTRAMUSCULAR | Status: AC | PRN
  Administered 2022-02-15: 100 mL via INTRAVENOUS

## 2022-02-15 MED ORDER — ONDANSETRON 4 MG PO TBDP: 4.0000 mg | ORAL_TABLET | Freq: Three times a day (TID) | ORAL | 0 refills | Status: AC | PRN

## 2022-02-15 MED ORDER — LACTATED RINGERS IV BOLUS
1000.0000 mL | Freq: Once | INTRAVENOUS | Status: AC
  Administered 2022-02-15: 1000 mL via INTRAVENOUS

## 2022-02-15 NOTE — Discharge Instructions (Addendum)
The CT scan had an incidental finding of a probable hydrocele, this is a mass in the testicle that are usually not dangerous.  I recommend that you follow-up with urology for further evaluation. ?

## 2022-02-19 ENCOUNTER — Encounter (INDEPENDENT_AMBULATORY_CARE_PROVIDER_SITE_OTHER): Payer: Self-pay | Admitting: Ophthalmology

## 2022-02-19 ENCOUNTER — Ambulatory Visit (INDEPENDENT_AMBULATORY_CARE_PROVIDER_SITE_OTHER): Payer: Medicare Other | Admitting: Ophthalmology

## 2022-02-19 DIAGNOSIS — Z720 Tobacco use: Secondary | ICD-10-CM

## 2022-02-19 DIAGNOSIS — H353211 Exudative age-related macular degeneration, right eye, with active choroidal neovascularization: Secondary | ICD-10-CM | POA: Diagnosis not present

## 2022-02-19 MED ORDER — BEVACIZUMAB 2.5 MG/0.1ML IZ SOSY
2.5000 mg | PREFILLED_SYRINGE | INTRAVITREAL | Status: AC | PRN
  Administered 2022-02-19: 2.5 mg via INTRAVITREAL

## 2022-02-19 NOTE — Progress Notes (Signed)
? ? ?02/19/2022 ? ?  ? ?CHIEF COMPLAINT ?Patient presents for  ?Chief Complaint  ?Patient presents with  ? Macular Degeneration  ? ? ? ? ?HISTORY OF PRESENT ILLNESS: ?Victor Walker is a 66 y.o. male who presents to the clinic today for:  ? ?HPI   ?7 weeks dilate OD, Avastin OD, OCT. ?Patient states vision is stable and unchanged since last visit. Denies any new floaters or FOL. ?Pt was in hospital for food poisoning last Friday 02/13/2022. ?Last edited by Nelva Nay on 02/19/2022  3:57 PM.  ?  ? ? ?Referring physician: ?[name], MD ?[phone] ?[place],  [place] [address] ? ?HISTORICAL INFORMATION:  ? ?Selected notes from the MEDICAL RECORD NUMBER ?  ?   ? ?CURRENT MEDICATIONS: ?Current Outpatient Medications (Ophthalmic Drugs)  ?Medication Sig  ? ofloxacin (OCUFLOX) 0.3 % ophthalmic solution  (Patient not taking: No sig reported)  ? prednisoLONE acetate (PRED FORTE) 1 % ophthalmic suspension SMARTSIG:In Eye(s) (Patient not taking: No sig reported)  ? ?No current facility-administered medications for this visit. (Ophthalmic Drugs)  ? ?Current Outpatient Medications (Other)  ?Medication Sig  ? atorvastatin (LIPITOR) 20 MG tablet Take 1 tablet by mouth daily.  ? clopidogrel (PLAVIX) 75 MG tablet Take 1 tablet (75 mg total) by mouth daily. (Patient not taking: Reported on 01/22/2021)  ? metoprolol tartrate (LOPRESSOR) 25 MG tablet Take 1 tablet (25 mg total) by mouth 2 (two) times daily.  ? nitroGLYCERIN (NITROSTAT) 0.4 MG SL tablet Place 1 tablet (0.4 mg total) under the tongue every 5 (five) minutes as needed for chest pain.  ? ondansetron (ZOFRAN-ODT) 4 MG disintegrating tablet Take 1 tablet (4 mg total) by mouth every 8 (eight) hours as needed for nausea or vomiting.  ? terbinafine (LAMISIL) 250 MG tablet Take 1 tablet (250 mg total) by mouth daily.  ? traMADol (ULTRAM) 50 MG tablet Take 1-2 tablets by mouth as needed.  ? ?No current facility-administered medications for this  visit. (Other)  ? ? ? ? ?REVIEW OF SYSTEMS: ? ? ? ?ALLERGIES ?Allergies  ?Allergen Reactions  ? Aspirin Anaphylaxis  ? Penicillins Anaphylaxis  ?  Has patient had a PCN reaction causing immediate rash, facial/tongue/throat swelling, SOB or lightheadedness with hypotension:YES ?Has patient had a PCN reaction causing severe rash involving mucus membranes or skin necrosis: NO ?Has patient had a PCN reaction that required hospitalization NO ?Has patient had a PCN reaction occurring within the last 10 years:NO ?If all of the above answers are "NO", then may proceed with Cephalosporin use.  ? Shrimp [Shellfish Allergy] Anaphylaxis  ? ? ?PAST MEDICAL HISTORY ?Past Medical History:  ?Diagnosis Date  ? Corneal abrasion, right, initial encounter 02/20/2021  ? Patient awakened this morning with a foreign body sensation in his eye.  ? H/O bilateral inguinal hernia repair   ? TB (pulmonary tuberculosis)   ? ?Past Surgical History:  ?Procedure Laterality Date  ? CATARACT EXTRACTION Right 01/29/2021  ? Dr. [name]  ? CATARACT EXTRACTION Left 02/05/2021  ? Dr. [name]  ? HERNIA REPAIR    ? KIDNEY STONE SURGERY    ? ? ?FAMILY HISTORY ?Family History  ?Problem Relation Age of Onset  ? Diabetes Mother   ? Hypertension Mother   ? ? ?SOCIAL HISTORY ?Social History  ? ?Tobacco Use  ? Smoking status: Every Day  ? Smokeless tobacco: Never  ?Vaping Use  ? Vaping Use: Never used  ?Substance Use Topics  ? Alcohol use:  Yes  ? Drug use: Yes  ?  Types: Marijuana  ? ?  ? ?  ? ?OPHTHALMIC EXAM: ? ?Base Eye Exam   ? ? Visual Acuity (ETDRS)   ? ?   Right Left  ? Dist [place] 20/25 -1 20/20  ? ?  ?  ? ? Tonometry (Tonopen, 3:55 PM)   ? ?   Right Left  ? Pressure 6 8  ? ?  ?  ? ? Pupils   ? ?   Pupils Dark Light APD  ? Right PERRL 3 2 None  ? Left PERRL 3 2 None  ? ?  ?  ? ? Extraocular Movement   ? ?   Right Left  ?  Full Full  ? ?  ?  ? ? Neuro/Psych   ? ? Oriented x3: Yes  ? Mood/Affect: Normal  ? ?  ?  ? ? Dilation   ? ? Both eyes: 1.0% Mydriacyl, 2.5%  Phenylephrine @ 3:57 PM  ? ?  ?  ? ?  ? ?Slit Lamp and Fundus Exam   ? ? External Exam   ? ?   Right Left  ? External Normal Normal  ? ?  ?  ? ? Slit Lamp Exam   ? ?   Right Left  ? Lids/Lashes Normal Normal  ? Conjunctiva/Sclera White and quiet White and quiet  ? Cornea Clear, no epi defects Clear  ? Anterior Chamber Deep and quiet,,  no cells,  Deep and quiet  ? Iris Round and reactive Round and reactive  ? Lens Centered posterior chamber intraocular lens Centered posterior chamber intraocular lens  ? Anterior Vitreous Normal Normal  ? ?  ?  ? ? Fundus Exam   ? ?   Right Left  ? Posterior Vitreous Posterior vitreous detachment   ? Disc Normal   ? C/D Ratio 0.2   ? Macula Less macular thickening, Hard drusen, Retinal pigment epithelial mottling, Retinal pigment epithelial detachment, Subretinal neovascular membrane much less active   ? Vessels Normal   ? Periphery Normal   ? ?  ?  ? ?  ? ? ?IMAGING AND PROCEDURES  ?Imaging and Procedures for 02/19/22 ? ?OCT, Retina - OU - Both Eyes   ? ?   ?Right Eye ?Quality was good. Scan locations included subfoveal. Central Foveal Thickness: 256. Progression has improved. Findings include abnormal foveal contour, intraretinal fluid, disciform scar.  ? ?Left Eye ?Quality was good. Scan locations included subfoveal. Central Foveal Thickness: 278. Progression has improved. Findings include abnormal foveal contour, intraretinal fluid, disciform scar, subretinal hyper-reflective material.  ? ?Notes ?Improving macular condition in each eye post recent Avastin OD, and less subretinal fluid.  Today at 7-week postinjection with less subretinal fluid, repeat injection OD TODAY  ? ? ?OS, still active superonasal to the FAZ, repeat examination left eye  for residual intraretinal fluid temporally, currently at  3 weeks week interval, improved and stable, and follow-up OS, with much less CNVM activity superonasal.  F/U AS SCHEDULED ? ? ? ?  ? ?Intravitreal Injection, Pharmacologic Agent - OD  - Right Eye   ? ?   ?Time Out ?02/19/2022. 4:09 PM. Confirmed correct patient, procedure, site, and patient consented.  ? ?Anesthesia ?Topical anesthesia was used. Anesthetic medications included Lidocaine 4%.  ? ?Procedure ?Preparation included 5% betadine to ocular surface, 10% betadine to eyelids, Tobramycin 0.3%. A 30 gauge needle was used.  ? ?Injection: ?2.5 mg bevacizumab 2.5 MG/0.1ML ?  Route:  Intravitreal, Site: Right Eye ?  NDC: 62831-517-61, Lot: [id number]  ? ?Post-op ?Post injection exam found visual acuity of at least counting fingers. The patient tolerated the procedure well. There were no complications. The patient received written and verbal post procedure care education. Post injection medications included ocuflox.  ? ?  ? ? ?  ?  ? ?  ?ASSESSMENT/PLAN: ? ?Exudative age-related macular degeneration of right eye with active choroidal neovascularization (HCC) ?OD vastly improved macular findings and stable acuity currently at 7-week follow-up. ? ?We will maintain 7-week follow-up as patient is still high risk for recurrences as he is still a smoker trying to recover. ? ?Currently attempting to quit smoking ?Discussed with the patient the critical portance of discontinuing smoking because of the clinical side effect of carbon oxide inhalation leading to oxygen starvation to the macular region worsening macular degeneration and its complications  ? ?  ICD-10-CM   ?1. Exudative age-related macular degeneration of right eye with active choroidal neovascularization (HCC)  H35.3211 OCT, Retina - OU - Both Eyes  ?  Intravitreal Injection, Pharmacologic Agent - OD - Right Eye  ?  bevacizumab (AVASTIN) SOSY 2.5 mg  ?  ?2. Currently attempting to quit smoking  Z72.0   ?  ? ? ?1.  Repeat injection OD today of Avastin to prevent CNVM recurrence maintain good acuity currently at 7-week follow-up interval ? ?2.  OS follow-up as scheduled ? ?3. ? ?Ophthalmic Meds Ordered this visit:  ?Meds ordered this encounter   ?Medications  ? bevacizumab (AVASTIN) SOSY 2.5 mg  ? ? ?  ? ?Return in about 7 weeks (around 04/09/2022) for dilate, OD, AVASTIN OCT, and follow-up OS as scheduled. ? ?There are no Patient Instructions on file

## 2022-02-19 NOTE — Assessment & Plan Note (Signed)
Discussed with the patient the critical portance of discontinuing smoking because of the clinical side effect of carbon oxide inhalation leading to oxygen starvation to the macular region worsening macular degeneration and its complications ?

## 2022-02-19 NOTE — Assessment & Plan Note (Signed)
OD vastly improved macular findings and stable acuity currently at 7-week follow-up. ? ?We will maintain 7-week follow-up as patient is still high risk for recurrences as he is still a smoker trying to recover. ?

## 2022-03-24 ENCOUNTER — Encounter (INDEPENDENT_AMBULATORY_CARE_PROVIDER_SITE_OTHER): Payer: Self-pay | Admitting: Ophthalmology

## 2022-03-24 ENCOUNTER — Ambulatory Visit (INDEPENDENT_AMBULATORY_CARE_PROVIDER_SITE_OTHER): Payer: Medicare Other | Admitting: Ophthalmology

## 2022-03-24 DIAGNOSIS — H353221 Exudative age-related macular degeneration, left eye, with active choroidal neovascularization: Secondary | ICD-10-CM

## 2022-03-24 DIAGNOSIS — H353211 Exudative age-related macular degeneration, right eye, with active choroidal neovascularization: Secondary | ICD-10-CM

## 2022-03-24 MED ORDER — BEVACIZUMAB 2.5 MG/0.1ML IZ SOSY
2.5000 mg | PREFILLED_SYRINGE | INTRAVITREAL | Status: AC | PRN
  Administered 2022-03-24: 2.5 mg via INTRAVITREAL

## 2022-03-24 NOTE — Progress Notes (Signed)
? ? ?03/24/2022 ? ?  ? ?CHIEF COMPLAINT ?Patient presents for  ?Chief Complaint  ?Patient presents with  ? Macular Degeneration  ? ? ? ? ?HISTORY OF PRESENT ILLNESS: ?Victor Walker is a 66 y.o. male who presents to the clinic today for:  ? ?HPI   ?8 weeks dilate OS, Avastin OS, oct. ?Patient states "I still have distortion in the right eye, the right side of my vision is curved. My left eye is good. Since I have been wearing the glasses I haven't dealt with double vision. I got the glasses around the time I had the last visit here." ?Patient is not using any prescription eye drops currently, requests this to be off medication list. ? ?Patient reports 2 weeks of no smoking, self treating with patch and No smoking  ?Last edited by Edmon Crape, MD on 03/24/2022  4:57 PM.  ?  ? ? ?Referring physician: ?[name], MD ?[phone] ?[place],  [place] [address] ? ?HISTORICAL INFORMATION:  ? ?Selected notes from the MEDICAL RECORD NUMBER ?  ?   ? ?CURRENT MEDICATIONS: ?Current Outpatient Medications (Ophthalmic Drugs)  ?Medication Sig  ? ofloxacin (OCUFLOX) 0.3 % ophthalmic solution  (Patient not taking: No sig reported)  ? prednisoLONE acetate (PRED FORTE) 1 % ophthalmic suspension SMARTSIG:In Eye(s) (Patient not taking: No sig reported)  ? ?No current facility-administered medications for this visit. (Ophthalmic Drugs)  ? ?Current Outpatient Medications (Other)  ?Medication Sig  ? atorvastatin (LIPITOR) 20 MG tablet Take 1 tablet by mouth daily.  ? clopidogrel (PLAVIX) 75 MG tablet Take 1 tablet (75 mg total) by mouth daily. (Patient not taking: Reported on 01/22/2021)  ? metoprolol tartrate (LOPRESSOR) 25 MG tablet Take 1 tablet (25 mg total) by mouth 2 (two) times daily.  ? nitroGLYCERIN (NITROSTAT) 0.4 MG SL tablet Place 1 tablet (0.4 mg total) under the tongue every 5 (five) minutes as needed for chest pain.  ? ondansetron (ZOFRAN-ODT) 4 MG disintegrating tablet Take 1 tablet (4 mg total) by  mouth every 8 (eight) hours as needed for nausea or vomiting.  ? terbinafine (LAMISIL) 250 MG tablet Take 1 tablet (250 mg total) by mouth daily.  ? traMADol (ULTRAM) 50 MG tablet Take 1-2 tablets by mouth as needed.  ? ?No current facility-administered medications for this visit. (Other)  ? ? ? ? ?REVIEW OF SYSTEMS: ?ROS   ?Negative for: Constitutional, Gastrointestinal, Neurological, Skin, Genitourinary, Musculoskeletal, HENT, Endocrine, Cardiovascular, Eyes, Respiratory, Psychiatric, Allergic/Imm, Heme/Lymph ?Last edited by Edmon Crape, MD on 03/24/2022  4:56 PM.  ?  ? ? ? ?ALLERGIES ?Allergies  ?Allergen Reactions  ? Aspirin Anaphylaxis  ? Penicillins Anaphylaxis  ?  Has patient had a PCN reaction causing immediate rash, facial/tongue/throat swelling, SOB or lightheadedness with hypotension:YES ?Has patient had a PCN reaction causing severe rash involving mucus membranes or skin necrosis: NO ?Has patient had a PCN reaction that required hospitalization NO ?Has patient had a PCN reaction occurring within the last 10 years:NO ?If all of the above answers are "NO", then may proceed with Cephalosporin use.  ? Shrimp [Shellfish Allergy] Anaphylaxis  ? ? ?PAST MEDICAL HISTORY ?Past Medical History:  ?Diagnosis Date  ? Corneal abrasion, right, initial encounter 02/20/2021  ? Patient awakened this morning with a foreign body sensation in his eye.  ? H/O bilateral inguinal hernia repair   ? TB (pulmonary tuberculosis)   ? ?Past Surgical History:  ?Procedure Laterality Date  ? CATARACT EXTRACTION Right  01/29/2021  ? Dr. Gershon Crane  ? CATARACT EXTRACTION Left 02/05/2021  ? Dr. Gershon Crane  ? HERNIA REPAIR    ? KIDNEY STONE SURGERY    ? ? ?FAMILY HISTORY ?Family History  ?Problem Relation Age of Onset  ? Diabetes Mother   ? Hypertension Mother   ? ? ?SOCIAL HISTORY ?Social History  ? ?Tobacco Use  ? Smoking status: Every Day  ? Smokeless tobacco: Never  ?Vaping Use  ? Vaping Use: Never used  ?Substance Use Topics  ? Alcohol use:  Yes  ? Drug use: Yes  ?  Types: Marijuana  ? ?  ? ?  ? ?OPHTHALMIC EXAM: ? ?Base Eye Exam   ? ? Visual Acuity (ETDRS)   ? ?   Right Left  ? Dist cc 20/25 -1 20/20  ? ? Correction: Glasses  ? ?  ?  ? ? Tonometry (Tonopen, 3:58 PM)   ? ?   Right Left  ? Pressure 12 10  ? ?  ?  ? ? Pupils   ? ?   Pupils APD  ? Right PERRL None  ? Left PERRL None  ? ?  ?  ? ? Extraocular Movement   ? ?   Right Left  ?  Full Full  ? ?  ?  ? ? Neuro/Psych   ? ? Oriented x3: Yes  ? Mood/Affect: Normal  ? ?  ?  ? ? Dilation   ? ? Left eye: 1.0% Mydriacyl, 2.5% Phenylephrine @ 3:58 PM  ? ?  ?  ? ?  ? ?Slit Lamp and Fundus Exam   ? ? External Exam   ? ?   Right Left  ? External Normal Normal  ? ?  ?  ? ? Slit Lamp Exam   ? ?   Right Left  ? Lids/Lashes Normal Normal  ? Conjunctiva/Sclera White and quiet White and quiet  ? Cornea Clear, no epi defects Clear  ? Anterior Chamber Deep and quiet,,  no cells,  Deep and quiet  ? Iris Round and reactive Round and reactive  ? Lens Centered posterior chamber intraocular lens Centered posterior chamber intraocular lens  ? Anterior Vitreous Normal Normal  ? ?  ?  ? ? Fundus Exam   ? ?   Right Left  ? Posterior Vitreous  Posterior vitreous detachment  ? Disc  Normal  ? C/D Ratio  0.2  ? Macula  Pigmented nodule superonasal to the macula foveal region left eye, smaller ring of atrophy no subretinal fluid, smaller today  ? Vessels  Normal  ? Periphery  Normal  ? ?  ?  ? ?  ? ? ?IMAGING AND PROCEDURES  ?Imaging and Procedures for 03/24/22 ? ?Intravitreal Injection, Pharmacologic Agent - OS - Left Eye   ? ?   ?Time Out ?03/24/2022. 4:58 PM. Confirmed correct patient, procedure, site, and patient consented.  ? ?Anesthesia ?Topical anesthesia was used. Anesthetic medications included Lidocaine 4%.  ? ?Procedure ?Preparation included Tobramycin 0.3%, Ofloxacin , 10% betadine to eyelids, 5% betadine to ocular surface. A 30 gauge needle was used.  ? ?Injection: ?2.5 mg bevacizumab 2.5 MG/0.1ML ?  Route:  Intravitreal, Site: Left Eye ?  [place]: [id number], [id number]  ? ?Post-op ?Post injection exam found visual acuity of at least counting fingers. The patient tolerated the procedure well. There were no complications. The patient received written and verbal post procedure care education. Post injection medications included ocuflox.  ? ?  ? ?OCT, Retina -  OU - Both Eyes   ? ?   ?Right Eye ?Quality was good. Scan locations included subfoveal. Central Foveal Thickness: 254. Progression has improved. Findings include abnormal foveal contour, intraretinal fluid, disciform scar.  ? ?Left Eye ?Quality was good. Scan locations included subfoveal. Central Foveal Thickness: 282. Progression has improved. Findings include abnormal foveal contour, intraretinal fluid, disciform scar, subretinal hyper-reflective material.  ? ?Notes ?Improving macular condition in each eye post recent Avastin OD, and less subretinal fluid.  Today at 5-week postinjection with less subretinal fluid, repeat injection OD TODAY  ? ? ?OS, still active superonasal to the FAZ, repeat examination left eye  for residual intraretinal fluid temporally, currently at  8 weeks week interval, improved and stable, and follow-up OS, with much less CNVM activity superonasal.  F/U AS SCHEDULED ? ? ? ?  ? ? ?  ?  ? ?  ?ASSESSMENT/PLAN: ? ?Exudative age-related macular degeneration of right eye with active choroidal neovascularization ([place]) ?OD, currently at 5-week interval.  Follow-up as scheduled ? ?Exudative age-related macular degeneration of left eye with active choroidal neovascularization ([place]) ?OS, improved macular anatomy, at 8-week follow-up interval, we will follow-up again next in 9 weeks to maintain after repeat injection Avastin OS today  ? ?  ICD-10-CM   ?1. Exudative age-related macular degeneration of left eye with active choroidal neovascularization (HCC)  H35.3221 Intravitreal Injection, Pharmacologic Agent - OS - Left Eye  ?  OCT, Retina - OU - Both  Eyes  ?  bevacizumab (AVASTIN) SOSY 2.5 mg  ?  ?2. Exudative age-related macular degeneration of right eye with active choroidal neovascularization ([place])  H35.3211   ?  ? ? ?1.  OD doing very well. ? ?2.  OS, regi

## 2022-03-24 NOTE — Assessment & Plan Note (Signed)
OD, currently at 5-week interval.  Follow-up as scheduled ?

## 2022-03-24 NOTE — Assessment & Plan Note (Signed)
OS, improved macular anatomy, at 8-week follow-up interval, we will follow-up again next in 9 weeks to maintain after repeat injection Avastin OS today ?

## 2022-04-08 ENCOUNTER — Encounter (INDEPENDENT_AMBULATORY_CARE_PROVIDER_SITE_OTHER): Payer: Self-pay | Admitting: Ophthalmology

## 2022-04-08 ENCOUNTER — Ambulatory Visit (INDEPENDENT_AMBULATORY_CARE_PROVIDER_SITE_OTHER): Payer: Medicare Other | Admitting: Ophthalmology

## 2022-04-08 DIAGNOSIS — Z87891 Personal history of nicotine dependence: Secondary | ICD-10-CM

## 2022-04-08 DIAGNOSIS — H353211 Exudative age-related macular degeneration, right eye, with active choroidal neovascularization: Secondary | ICD-10-CM

## 2022-04-08 DIAGNOSIS — H353221 Exudative age-related macular degeneration, left eye, with active choroidal neovascularization: Secondary | ICD-10-CM

## 2022-04-08 MED ORDER — BEVACIZUMAB 2.5 MG/0.1ML IZ SOSY
2.5000 mg | PREFILLED_SYRINGE | INTRAVITREAL | Status: AC | PRN
  Administered 2022-04-08: 2.5 mg via INTRAVITREAL

## 2022-04-08 NOTE — Assessment & Plan Note (Signed)
OS doing well 2 weeks post most recent injection follow-up as scheduled ?

## 2022-04-08 NOTE — Assessment & Plan Note (Signed)
Today at 7-week interval vastly improved over the last 15 months macular findings as well as vision.  Lesion nasal to FAZ still controlled at 7-week post Avastin.  Repeat injection today follow-up next in 8 weeks ?

## 2022-04-08 NOTE — Progress Notes (Signed)
? ? ?04/08/2022 ? ?  ? ?CHIEF COMPLAINT ?Patient presents for  ?Chief Complaint  ?Patient presents with  ? Macular Degeneration  ? ? ? ? ?HISTORY OF PRESENT ILLNESS: ?Victor Walker is a 66 y.o. male who presents to the clinic today for:  ? ?HPI   ?7 weeks for DILATE, OD AVASTIN OCT. ?Pt stated vision has been stable since last visit. ?Pt denies floaters and FOL. ? ? ?Last edited by Angeline Slim on 04/08/2022  3:46 PM.  ?  ? ? ?Referring physician: ?[name], MD ?[phone] ?[place],  [place] [address] ? ?HISTORICAL INFORMATION:  ? ?Selected notes from the MEDICAL RECORD NUMBER ?  ?   ? ?CURRENT MEDICATIONS: ?Current Outpatient Medications (Ophthalmic Drugs)  ?Medication Sig  ? ofloxacin (OCUFLOX) 0.3 % ophthalmic solution  (Patient not taking: No sig reported)  ? prednisoLONE acetate (PRED FORTE) 1 % ophthalmic suspension SMARTSIG:In Eye(s) (Patient not taking: No sig reported)  ? ?No current facility-administered medications for this visit. (Ophthalmic Drugs)  ? ?Current Outpatient Medications (Other)  ?Medication Sig  ? atorvastatin (LIPITOR) 20 MG tablet Take 1 tablet by mouth daily.  ? clopidogrel (PLAVIX) 75 MG tablet Take 1 tablet (75 mg total) by mouth daily. (Patient not taking: Reported on 01/22/2021)  ? metoprolol tartrate (LOPRESSOR) 25 MG tablet Take 1 tablet (25 mg total) by mouth 2 (two) times daily.  ? nitroGLYCERIN (NITROSTAT) 0.4 MG SL tablet Place 1 tablet (0.4 mg total) under the tongue every 5 (five) minutes as needed for chest pain.  ? ondansetron (ZOFRAN-ODT) 4 MG disintegrating tablet Take 1 tablet (4 mg total) by mouth every 8 (eight) hours as needed for nausea or vomiting.  ? terbinafine (LAMISIL) 250 MG tablet Take 1 tablet (250 mg total) by mouth daily.  ? traMADol (ULTRAM) 50 MG tablet Take 1-2 tablets by mouth as needed.  ? ?No current facility-administered medications for this visit. (Other)  ? ? ? ? ?REVIEW OF SYSTEMS: ?ROS   ?Negative for: Constitutional,  Gastrointestinal, Neurological, Skin, Genitourinary, Musculoskeletal, HENT, Endocrine, Cardiovascular, Eyes, Respiratory, Psychiatric, Allergic/Imm, Heme/Lymph ?Last edited by Angeline Slim on 04/08/2022  3:46 PM.  ?  ? ? ? ?ALLERGIES ?Allergies  ?Allergen Reactions  ? Aspirin Anaphylaxis  ? Penicillins Anaphylaxis  ?  Has patient had a PCN reaction causing immediate rash, facial/tongue/throat swelling, SOB or lightheadedness with hypotension:YES ?Has patient had a PCN reaction causing severe rash involving mucus membranes or skin necrosis: NO ?Has patient had a PCN reaction that required hospitalization NO ?Has patient had a PCN reaction occurring within the last 10 years:NO ?If all of the above answers are "NO", then may proceed with Cephalosporin use.  ? Shrimp [Shellfish Allergy] Anaphylaxis  ? ? ?PAST MEDICAL HISTORY ?Past Medical History:  ?Diagnosis Date  ? Corneal abrasion, right, initial encounter 02/20/2021  ? Patient awakened this morning with a foreign body sensation in his eye.  ? H/O bilateral inguinal hernia repair   ? TB (pulmonary tuberculosis)   ? ?Past Surgical History:  ?Procedure Laterality Date  ? CATARACT EXTRACTION Right 01/29/2021  ? Dr. [name]  ? CATARACT EXTRACTION Left 02/05/2021  ? Dr. [name]  ? HERNIA REPAIR    ? KIDNEY STONE SURGERY    ? ? ?FAMILY HISTORY ?Family History  ?Problem Relation Age of Onset  ? Diabetes Mother   ? Hypertension Mother   ? ? ?SOCIAL HISTORY ?Social History  ? ?Tobacco Use  ? Smoking status: Every Day  ?  Smokeless tobacco: Never  ?Vaping Use  ? Vaping Use: Never used  ?Substance Use Topics  ? Alcohol use: Yes  ? Drug use: Yes  ?  Types: Marijuana  ? ?  ? ?  ? ?OPHTHALMIC EXAM: ? ?Base Eye Exam   ? ? Visual Acuity (ETDRS)   ? ?   Right Left  ? Dist cc 20/30 -2 20/20 -1  ? Dist ph cc 20/25 -1   ? ? Correction: Glasses  ? ?  ?  ? ? Tonometry (Tonopen, 3:51 PM)   ? ?   Right Left  ? Pressure 9 10  ? ?  ?  ? ? Pupils   ? ?   Pupils APD  ? Right PERRL None  ? Left PERRL  None  ? ?  ?  ? ? Visual Fields   ? ?   Left Right  ?  Full Full  ? ?  ?  ? ? Extraocular Movement   ? ?   Right Left  ?  Full Full  ? ?  ?  ? ? Neuro/Psych   ? ? Oriented x3: Yes  ? Mood/Affect: Normal  ? ?  ?  ? ? Dilation   ? ? Right eye: 2.5% Phenylephrine, 1.0% Mydriacyl @ 3:51 PM  ? ?  ?  ? ?  ? ?Slit Lamp and Fundus Exam   ? ? External Exam   ? ?   Right Left  ? External Normal Normal  ? ?  ?  ? ? Slit Lamp Exam   ? ?   Right Left  ? Lids/Lashes Normal Normal  ? Conjunctiva/Sclera White and quiet White and quiet  ? Cornea Clear, no epi defects Clear  ? Anterior Chamber Deep and quiet,,  no cells,  Deep and quiet  ? Iris Round and reactive Round and reactive  ? Lens Centered posterior chamber intraocular lens Centered posterior chamber intraocular lens  ? Anterior Vitreous Normal Normal  ? ?  ?  ? ? Fundus Exam   ? ?   Right Left  ? Posterior Vitreous Posterior vitreous detachment   ? Disc Normal   ? C/D Ratio 0.2   ? Macula Less macular thickening, Hard drusen, Retinal pigment epithelial mottling, Retinal pigment epithelial detachment, Subretinal neovascular membrane much less active   ? Vessels Normal   ? Periphery Normal   ? ?  ?  ? ?  ? ? ?IMAGING AND PROCEDURES  ?Imaging and Procedures for 04/08/22 ? ?OCT, Retina - OU - Both Eyes   ? ?   ?Right Eye ?Quality was good. Scan locations included subfoveal. Central Foveal Thickness: 250. Progression has improved. Findings include abnormal foveal contour, intraretinal fluid, disciform scar.  ? ?Left Eye ?Quality was good. Scan locations included subfoveal. Central Foveal Thickness: 282. Progression has improved. Findings include abnormal foveal contour, intraretinal fluid, disciform scar, subretinal hyper-reflective material.  ? ?Notes ?Improving macular condition in each eye post recent Avastin OD, and less subretinal fluid.  Today at 7-week postinjection with less subretinal fluid, repeat injection OD TODAY  ? ? ?OS, still active superonasal to the FAZ, repeat  examination left eye  for residual intraretinal fluid temporally, currently at  2 weeks week interval, improved and stable, and follow-up OS, with much less CNVM activity superonasal.  F/U AS SCHEDULED ? ? ? ?  ? ?Intravitreal Injection, Pharmacologic Agent - OD - Right Eye   ? ?   ?Time Out ?04/08/2022. 4:30 PM. Confirmed correct  patient, procedure, site, and patient consented.  ? ?Anesthesia ?Topical anesthesia was used. Anesthetic medications included Lidocaine 4%.  ? ?Procedure ?Preparation included 5% betadine to ocular surface, 10% betadine to eyelids, Tobramycin 0.3%. A 30 gauge needle was used.  ? ?Injection: ?2.5 mg bevacizumab 2.5 MG/0.1ML ?  Route: Intravitreal, Site: Right Eye ?  NDC: 16073-710-62, Lot: [id number], Expiration date: 05/28/2022  ? ?Post-op ?Post injection exam found visual acuity of at least counting fingers. The patient tolerated the procedure well. There were no complications. The patient received written and verbal post procedure care education. Post injection medications included ocuflox.  ? ?  ? ? ?  ?  ? ?  ?ASSESSMENT/PLAN: ? ?Discontinued smoking ?I encourage patient to continue to not smoke 1 day at a time ? ?Exudative age-related macular degeneration of right eye with active choroidal neovascularization (HCC) ?Today at 7-week interval vastly improved over the last 15 months macular findings as well as vision.  Lesion nasal to FAZ still controlled at 7-week post Avastin.  Repeat injection today follow-up next in 8 weeks ? ?Exudative age-related macular degeneration of left eye with active choroidal neovascularization (HCC) ?OS doing well 2 weeks post most recent injection follow-up as scheduled  ? ?  ICD-10-CM   ?1. Exudative age-related macular degeneration of right eye with active choroidal neovascularization (HCC)  H35.3211 OCT, Retina - OU - Both Eyes  ?  Intravitreal Injection, Pharmacologic Agent - OD - Right Eye  ?  bevacizumab (AVASTIN) SOSY 2.5 mg  ?  ?2. Discontinued smoking   Z87.891   ?  ?3. Exudative age-related macular degeneration of left eye with active choroidal neovascularization (HCC)  H35.3221   ?  ? ? ?1.  OD vastly improved overall, with improved acuity.  Today's OCT r

## 2022-04-08 NOTE — Assessment & Plan Note (Signed)
I encourage patient to continue to not smoke 1 day at a time ?

## 2022-06-01 ENCOUNTER — Encounter (INDEPENDENT_AMBULATORY_CARE_PROVIDER_SITE_OTHER): Payer: Medicare (Managed Care) | Admitting: Ophthalmology

## 2022-06-04 ENCOUNTER — Encounter (INDEPENDENT_AMBULATORY_CARE_PROVIDER_SITE_OTHER): Payer: Medicare Other | Admitting: Ophthalmology

## 2022-06-23 ENCOUNTER — Encounter (INDEPENDENT_AMBULATORY_CARE_PROVIDER_SITE_OTHER): Payer: Self-pay | Admitting: Ophthalmology

## 2022-06-23 ENCOUNTER — Ambulatory Visit (INDEPENDENT_AMBULATORY_CARE_PROVIDER_SITE_OTHER): Payer: PPO | Admitting: Ophthalmology

## 2022-06-23 ENCOUNTER — Encounter (INDEPENDENT_AMBULATORY_CARE_PROVIDER_SITE_OTHER): Payer: Medicare Other | Admitting: Ophthalmology

## 2022-06-23 DIAGNOSIS — H353211 Exudative age-related macular degeneration, right eye, with active choroidal neovascularization: Secondary | ICD-10-CM | POA: Diagnosis not present

## 2022-06-23 DIAGNOSIS — H353221 Exudative age-related macular degeneration, left eye, with active choroidal neovascularization: Secondary | ICD-10-CM

## 2022-06-23 MED ORDER — BEVACIZUMAB CHEMO INJECTION 1.25MG/0.05ML SYRINGE FOR KALEIDOSCOPE
1.2500 mg | INTRAVITREAL | Status: AC | PRN
  Administered 2022-06-23: 1.25 mg via INTRAVITREAL

## 2022-06-23 NOTE — Assessment & Plan Note (Signed)
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  Much improved overall.  Coincident with also patient's discontinuing smoking  Repeat injection today given proximity of lesion to the fovea and good acuity.  And reevaluate probably in 2 to 3 months

## 2022-06-23 NOTE — Progress Notes (Signed)
06/23/2022     CHIEF COMPLAINT Patient presents for  Chief Complaint  Patient presents with   Macular Degeneration      HISTORY OF PRESENT ILLNESS: Victor Walker is a 66 y.o. male who presents to the clinic today for:   HPI   Pt states his vision has been stable  Pt denies any new floaters or FOL  Pt states he did have some pain in his eye's last week like a sharp quick pain Last edited by Aleene Davidson, CMA on 06/23/2022  3:45 PM.      Referring physician: [name], MD [id number] [place],  [place] [address]  HISTORICAL INFORMATION:   Selected notes from the MEDICAL RECORD NUMBER       CURRENT MEDICATIONS: Current Outpatient Medications (Ophthalmic Drugs)  Medication Sig   ofloxacin (OCUFLOX) 0.3 % ophthalmic solution  (Patient not taking: No sig reported)   prednisoLONE acetate (PRED FORTE) 1 % ophthalmic suspension SMARTSIG:In Eye(s) (Patient not taking: No sig reported)   No current facility-administered medications for this visit. (Ophthalmic Drugs)   Current Outpatient Medications (Other)  Medication Sig   atorvastatin (LIPITOR) 20 MG tablet Take 1 tablet by mouth daily.   clopidogrel (PLAVIX) 75 MG tablet Take 1 tablet (75 mg total) by mouth daily. (Patient not taking: Reported on 01/22/2021)   metoprolol tartrate (LOPRESSOR) 25 MG tablet Take 1 tablet (25 mg total) by mouth 2 (two) times daily.   nitroGLYCERIN (NITROSTAT) 0.4 MG SL tablet Place 1 tablet (0.4 mg total) under the tongue every 5 (five) minutes as needed for chest pain.   ondansetron (ZOFRAN-ODT) 4 MG disintegrating tablet Take 1 tablet (4 mg total) by mouth every 8 (eight) hours as needed for nausea or vomiting.   terbinafine (LAMISIL) 250 MG tablet Take 1 tablet (250 mg total) by mouth daily.   traMADol (ULTRAM) 50 MG tablet Take 1-2 tablets by mouth as needed.   No current facility-administered medications for this visit. (Other)      REVIEW OF  SYSTEMS: ROS   Negative for: Constitutional, Gastrointestinal, Neurological, Skin, Genitourinary, Musculoskeletal, HENT, Endocrine, Cardiovascular, Eyes, Respiratory, Psychiatric, Allergic/Imm, Heme/Lymph Last edited by Aleene Davidson, CMA on 06/23/2022  3:45 PM.       ALLERGIES Allergies  Allergen Reactions   Aspirin Anaphylaxis   Penicillins Anaphylaxis    Has patient had a PCN reaction causing immediate rash, facial/tongue/throat swelling, SOB or lightheadedness with hypotension:YES Has patient had a PCN reaction causing severe rash involving mucus membranes or skin necrosis: NO Has patient had a PCN reaction that required hospitalization NO Has patient had a PCN reaction occurring within the last 10 years:NO If all of the above answers are "NO", then may proceed with Cephalosporin use.   Shrimp [Shellfish Allergy] Anaphylaxis    PAST MEDICAL HISTORY Past Medical History:  Diagnosis Date   Corneal abrasion, right, initial encounter 02/20/2021   Patient awakened this morning with a foreign body sensation in his eye.   H/O bilateral inguinal hernia repair    TB (pulmonary tuberculosis)    Past Surgical History:  Procedure Laterality Date   CATARACT EXTRACTION Right 01/29/2021   Dr. [name]   CATARACT EXTRACTION Left 02/05/2021   Dr. [name]   HERNIA REPAIR     KIDNEY STONE SURGERY      FAMILY HISTORY Family History  Problem Relation Age of Onset   Diabetes Mother    Hypertension Mother     SOCIAL  HISTORY Social History   Tobacco Use   Smoking status: Every Day   Smokeless tobacco: Never  Vaping Use   Vaping Use: Never used  Substance Use Topics   Alcohol use: Yes   Drug use: Yes    Types: Marijuana         OPHTHALMIC EXAM:  Base Eye Exam     Visual Acuity (ETDRS)       Right Left   Dist [place] 20/40 20/30         Tonometry (Tonopen, 3:51 PM)       Right Left   Pressure 8 17         Pupils       Pupils APD   Right PERRL None   Left  PERRL None         Visual Fields       Left Right    Full Full         Extraocular Movement       Right Left    Full, Ortho Full, Ortho         Neuro/Psych     Oriented x3: Yes   Mood/Affect: Normal         Dilation     Right eye: 2.5% Phenylephrine, 1.0% Mydriacyl @ 3:47 PM           Slit Lamp and Fundus Exam     External Exam       Right Left   External Normal Normal         Slit Lamp Exam       Right Left   Lids/Lashes Normal Normal   Conjunctiva/Sclera White and quiet White and quiet   Cornea Clear, no epi defects Clear   Anterior Chamber Deep and quiet,,  no cells,  Deep and quiet   Iris Round and reactive Round and reactive   Lens Centered posterior chamber intraocular lens Centered posterior chamber intraocular lens   Anterior Vitreous Normal Normal         Fundus Exam       Right Left   Posterior Vitreous Posterior vitreous detachment    Disc Normal    C/D Ratio 0.2    Macula Less macular thickening, Hard drusen, Retinal pigment epithelial mottling, Retinal pigment epithelial detachment, Subretinal neovascular membrane much less active, Disciform scar juxta foveal    Vessels Normal    Periphery Normal             IMAGING AND PROCEDURES  Imaging and Procedures for 06/23/22  OCT, Retina - OU - Both Eyes       Right Eye Quality was good. Scan locations included subfoveal. Central Foveal Thickness: 250. Progression has improved. Findings include abnormal foveal contour, disciform scar, intraretinal fluid.   Left Eye Quality was good. Scan locations included subfoveal. Central Foveal Thickness: 282. Progression has improved. Findings include abnormal foveal contour, subretinal hyper-reflective material, disciform scar, intraretinal fluid.   Notes Improving macular condition in each eye post recent Avastin OD, and less subretinal fluid.  Today at 7-week week postinjection with less subretinal fluid, repeat injection OD TODAY  and reevaluate in 2 to 3 months   OS, still active superonasal to the FAZ, repeat examination left eye  for residual intraretinal fluid temporally, currently at 13 weeks interval, improved and stable, and follow-up OS, with much less CNVM activity superonasal.  We will continue to monitor as patient had inadvertent misses appointments       Intravitreal Injection, Pharmacologic  Agent - OD - Right Eye       Time Out 06/23/2022. 4:40 PM. Confirmed correct patient, procedure, site, and patient consented.   Anesthesia Topical anesthesia was used. Anesthetic medications included Lidocaine 4%.   Procedure Preparation included 5% betadine to ocular surface, 10% betadine to eyelids, Tobramycin 0.3%. A 30 gauge needle was used.   Injection: 1.25 mg Bevacizumab 1.25mg /0.101ml   Route: Intravitreal, Site: Right Eye   NDC: [id number]   Post-op Post injection exam found visual acuity of at least counting fingers. The patient tolerated the procedure well. There were no complications. The patient received written and verbal post procedure care education. Post injection medications included ocuflox.              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  Much improved overall.  Coincident with also patient's discontinuing smoking  Repeat injection today given proximity of lesion to the fovea and good acuity.  And reevaluate probably in 2 to 3 months    Exudative age-related macular degeneration of left eye with active choroidal neovascularization (HCC) Doing well OS no recurrence and 13 weeks after imposed lengthening of previous interval.  Will observe left eye and reevaluate in 4 to 5 weeks     ICD-10-CM   1. Exudative age-related macular degeneration of right eye with active choroidal  neovascularization (HCC)  H35.3211 OCT, Retina - OU - Both Eyes    Intravitreal Injection, Pharmacologic Agent - OD - Right Eye    Bevacizumab (AVASTIN) SOLN 1.25 mg    2. Exudative age-related macular degeneration of left eye with active choroidal neovascularization (HCC)  H35.3221       1.  OD vastly improved on intravitreal Avastin today at 11 weeks.  We will repeat injection today to maintain and extend interval examination next to 3 months and may continue to observe thereafter  2.  S inadvertent lengthening of follow-up interval to 13 weeks with no sign of recurrence we will continue to monitor and follow this time 4 to 5 weeks to confirm no recurrence and if no recurrence upon next visit in 4 to 5 weeks we will continue to monitor as well  3.  Ophthalmic Meds Ordered this visit:  Meds ordered this encounter  Medications   Bevacizumab (AVASTIN) SOLN 1.25 mg       Return in about 5 weeks (around 07/28/2022) for dilate, OS, OCT.  There are no Patient Instructions on file for this visit.   Explained the diagnoses, plan, and follow up with the patient and they expressed understanding.  Patient expressed understanding of the importance of proper follow up care.   Alford Highland Kambri Dismore M.D. Diseases & Surgery of the Retina and Vitreous [facility] 06/23/22     Abbreviations: M myopia (nearsighted); A astigmatism; H hyperopia (farsighted); P presbyopia; Mrx spectacle prescription;  CTL contact lenses; OD right eye; OS left eye; OU both eyes  XT exotropia; ET esotropia; PEK punctate epithelial keratitis; PEE punctate epithelial erosions; DES dry eye syndrome; MGD meibomian gland dysfunction; ATs artificial tears; PFAT's preservative free artificial tears; NSC nuclear sclerotic cataract; PSC posterior subcapsular cataract; ERM epi-retinal membrane; PVD posterior vitreous detachment; RD retinal detachment; DM diabetes mellitus; DR diabetic retinopathy; NPDR non-proliferative  diabetic retinopathy; PDR proliferative diabetic retinopathy; CSME clinically significant macular edema; DME diabetic macular edema; dbh dot  blot hemorrhages; CWS cotton wool spot; POAG primary open angle glaucoma; C/D cup-to-disc ratio; HVF humphrey visual field; GVF goldmann visual field; OCT optical coherence tomography; IOP intraocular pressure; BRVO Branch retinal vein occlusion; CRVO central retinal vein occlusion; CRAO central retinal artery occlusion; BRAO branch retinal artery occlusion; RT retinal tear; SB scleral buckle; PPV pars plana vitrectomy; VH Vitreous hemorrhage; PRP panretinal laser photocoagulation; IVK intravitreal kenalog; VMT vitreomacular traction; MH Macular hole;  NVD neovascularization of the disc; NVE neovascularization elsewhere; AREDS age related eye disease study; ARMD age related macular degeneration; POAG primary open angle glaucoma; EBMD epithelial/anterior basement membrane dystrophy; ACIOL anterior chamber intraocular lens; IOL intraocular lens; PCIOL posterior chamber intraocular lens; Phaco/IOL phacoemulsification with intraocular lens placement; [place] photorefractive keratectomy; LASIK laser assisted in situ keratomileusis; HTN hypertension; DM diabetes mellitus; COPD chronic obstructive pulmonary disease

## 2022-06-23 NOTE — Assessment & Plan Note (Signed)
Doing well OS no recurrence and 13 weeks after imposed lengthening of previous interval.  Will observe left eye and reevaluate in 4 to 5 weeks

## 2022-06-30 ENCOUNTER — Encounter (INDEPENDENT_AMBULATORY_CARE_PROVIDER_SITE_OTHER): Payer: Self-pay | Admitting: Ophthalmology

## 2022-06-30 ENCOUNTER — Ambulatory Visit (INDEPENDENT_AMBULATORY_CARE_PROVIDER_SITE_OTHER): Payer: PPO | Admitting: Ophthalmology

## 2022-06-30 DIAGNOSIS — Z87891 Personal history of nicotine dependence: Secondary | ICD-10-CM | POA: Diagnosis not present

## 2022-06-30 DIAGNOSIS — H353221 Exudative age-related macular degeneration, left eye, with active choroidal neovascularization: Secondary | ICD-10-CM

## 2022-06-30 MED ORDER — BEVACIZUMAB CHEMO INJECTION 1.25MG/0.05ML SYRINGE FOR KALEIDOSCOPE
1.2500 mg | INTRAVITREAL | Status: AC | PRN
  Administered 2022-06-30: 1.25 mg via INTRAVITREAL

## 2022-06-30 NOTE — Assessment & Plan Note (Signed)
OS currently at 13-week interval, with some signs of activity remaining nasally.  Repeat injection today and maintain 73-month follow-up OS

## 2022-06-30 NOTE — Progress Notes (Signed)
06/30/2022     CHIEF COMPLAINT Patient presents for  Chief Complaint  Patient presents with   Macular Degeneration      HISTORY OF PRESENT ILLNESS: Victor Walker is a 66 y.o. male who presents to the clinic today for:   HPI   9 weeks for DILATE OS, AVASTIN OCT. Pt stated vision remained stable since last visit.  Last edited by Angeline Slim on 06/30/2022  3:37 PM.      Referring physician: [name], MD [address] [place],  [place] [address]  HISTORICAL INFORMATION:   Selected notes from the MEDICAL RECORD NUMBER       CURRENT MEDICATIONS: Current Outpatient Medications (Ophthalmic Drugs)  Medication Sig   ofloxacin (OCUFLOX) 0.3 % ophthalmic solution  (Patient not taking: No sig reported)   prednisoLONE acetate (PRED FORTE) 1 % ophthalmic suspension SMARTSIG:In Eye(s) (Patient not taking: No sig reported)   No current facility-administered medications for this visit. (Ophthalmic Drugs)   Current Outpatient Medications (Other)  Medication Sig   atorvastatin (LIPITOR) 20 MG tablet Take 1 tablet by mouth daily.   clopidogrel (PLAVIX) 75 MG tablet Take 1 tablet (75 mg total) by mouth daily. (Patient not taking: Reported on 01/22/2021)   metoprolol tartrate (LOPRESSOR) 25 MG tablet Take 1 tablet (25 mg total) by mouth 2 (two) times daily.   nitroGLYCERIN (NITROSTAT) 0.4 MG SL tablet Place 1 tablet (0.4 mg total) under the tongue every 5 (five) minutes as needed for chest pain.   ondansetron (ZOFRAN-ODT) 4 MG disintegrating tablet Take 1 tablet (4 mg total) by mouth every 8 (eight) hours as needed for nausea or vomiting.   terbinafine (LAMISIL) 250 MG tablet Take 1 tablet (250 mg total) by mouth daily.   traMADol (ULTRAM) 50 MG tablet Take 1-2 tablets by mouth as needed.   No current facility-administered medications for this visit. (Other)      REVIEW OF SYSTEMS: ROS   Negative for: Constitutional, Gastrointestinal, Neurological, Skin,  Genitourinary, Musculoskeletal, HENT, Endocrine, Cardiovascular, Eyes, Respiratory, Psychiatric, Allergic/Imm, Heme/Lymph Last edited by Angeline Slim on 06/30/2022  3:37 PM.       ALLERGIES Allergies  Allergen Reactions   Aspirin Anaphylaxis   Penicillins Anaphylaxis    Has patient had a PCN reaction causing immediate rash, facial/tongue/throat swelling, SOB or lightheadedness with hypotension:YES Has patient had a PCN reaction causing severe rash involving mucus membranes or skin necrosis: NO Has patient had a PCN reaction that required hospitalization NO Has patient had a PCN reaction occurring within the last 10 years:NO If all of the above answers are "NO", then may proceed with Cephalosporin use.   Shrimp [Shellfish Allergy] Anaphylaxis    PAST MEDICAL HISTORY Past Medical History:  Diagnosis Date   Corneal abrasion, right, initial encounter 02/20/2021   Patient awakened this morning with a foreign body sensation in his eye.   H/O bilateral inguinal hernia repair    TB (pulmonary tuberculosis)    Past Surgical History:  Procedure Laterality Date   CATARACT EXTRACTION Right 01/29/2021   Dr. [name]   CATARACT EXTRACTION Left 02/05/2021   Dr. [name]   HERNIA REPAIR     KIDNEY STONE SURGERY      FAMILY HISTORY Family History  Problem Relation Age of Onset   Diabetes Mother    Hypertension Mother     SOCIAL HISTORY Social History   Tobacco Use   Smoking status: Every Day   Smokeless tobacco: Never  Vaping Use  Vaping Use: Never used  Substance Use Topics   Alcohol use: Yes   Drug use: Yes    Types: Marijuana         OPHTHALMIC EXAM:  Base Eye Exam     Visual Acuity (ETDRS)       Right Left   Dist cc 20/30 20/40 -2   Dist ph cc NI 20/20    Correction: Glasses         Tonometry (Tonopen, 3:42 PM)       Right Left   Pressure 15 12         Pupils       Pupils APD   Right PERRL None   Left PERRL None         Visual Fields       Left  Right    Full Full         Extraocular Movement       Right Left    Full, Ortho Full, Ortho         Neuro/Psych     Oriented x3: Yes   Mood/Affect: Normal         Dilation     Left eye: 2.5% Phenylephrine, 1.0% Mydriacyl @ 3:42 PM           Slit Lamp and Fundus Exam     External Exam       Right Left   External Normal Normal         Slit Lamp Exam       Right Left   Lids/Lashes Normal Normal   Conjunctiva/Sclera White and quiet White and quiet   Cornea Clear, no epi defects Clear   Anterior Chamber Deep and quiet,,  no cells,  Deep and quiet   Iris Round and reactive Round and reactive   Lens Centered posterior chamber intraocular lens Centered posterior chamber intraocular lens   Anterior Vitreous Normal Normal         Fundus Exam       Right Left   Posterior Vitreous  Posterior vitreous detachment   Disc  Normal   C/D Ratio  0.2   Macula  Pigmented nodule superonasal to the macula foveal region left eye, smaller ring of atrophy no subretinal fluid, smaller today   Vessels  Normal   Periphery  Normal            IMAGING AND PROCEDURES  Imaging and Procedures for 06/30/22  OCT, Retina - OU - Both Eyes       Right Eye Quality was good. Scan locations included subfoveal. Central Foveal Thickness: 252. Progression has improved. Findings include abnormal foveal contour, disciform scar, intraretinal fluid.   Left Eye Quality was good. Scan locations included subfoveal. Central Foveal Thickness: 284. Progression has improved. Findings include abnormal foveal contour, subretinal hyper-reflective material, disciform scar, intraretinal fluid.   Notes Improving macular condition in each eye post recent Avastin OD, and less subretinal fluid.  Today at 7-week week postinjection with less subretinal fluid, repeat injection OD TODAY and reevaluate in 2 to 3 months   OS, still active superonasal to the FAZ, repeat examination left eye  for  residual intraretinal fluid temporally, currently at 13 weeks interval, improved and stable, and follow-up OS, with much less CNVM activity superonasal.  We will continue to monitor as patient had inadvertent misses appointments       Intravitreal Injection, Pharmacologic Agent - OS - Left Eye       Time Out  06/30/2022. 4:25 PM. Confirmed correct patient, procedure, site, and patient consented.   Anesthesia Topical anesthesia was used. Anesthetic medications included Lidocaine 4%.   Procedure Preparation included 5% betadine to ocular surface, 10% betadine to eyelids, Tobramycin 0.3%, Ofloxacin . A 30 gauge needle was used.   Injection: 1.25 mg Bevacizumab 1.25mg /0.53ml   Route: Intravitreal, Site: Left Eye   NDC: [id number], Lot: [id number], Expiration date: 09/25/2022   Post-op Post injection exam found visual acuity of at least counting fingers. The patient tolerated the procedure well. There were no complications. The patient received written and verbal post procedure care education. Post injection medications included ocuflox.              ASSESSMENT/PLAN:  Exudative age-related macular degeneration of left eye with active choroidal neovascularization (HCC) OS currently at 13-week interval, with some signs of activity remaining nasally.  Repeat injection today and maintain 84-month follow-up OS  Discontinued smoking Patient continues to not smoke      ICD-10-CM   1. Exudative age-related macular degeneration of left eye with active choroidal neovascularization (HCC)  H35.3221 OCT, Retina - OU - Both Eyes    Intravitreal Injection, Pharmacologic Agent - OS - Left Eye    Bevacizumab (AVASTIN) SOLN 1.25 mg    2. Discontinued smoking  Z87.891       1.  OS doing very well and lengthening of interval examination and treatment likely on the basis of decreased exposure to carbon oxide from excessive and ongoing smoking in the past.  Discontinued smoking and now  coincident with a lengthen the interval of evaluation and successful therapy with Avastin.  2.  Dilate OS next in 3 months and reevaluate OD as scheduled  3.  Ophthalmic Meds Ordered this visit:  Meds ordered this encounter  Medications   Bevacizumab (AVASTIN) SOLN 1.25 mg       Return in about 3 months (around 09/30/2022) for dilate, OS, AVASTIN OCT, and follow-up dilate OD Avastin OCT as scheduled.  There are no Patient Instructions on file for this visit.   Explained the diagnoses, plan, and follow up with the patient and they expressed understanding.  Patient expressed understanding of the importance of proper follow up care.   Alford Highland Loreley Schwall M.D. Diseases & Surgery of the Retina and Vitreous [facility] 06/30/22     Abbreviations: M myopia (nearsighted); A astigmatism; H hyperopia (farsighted); P presbyopia; Mrx spectacle prescription;  CTL contact lenses; OD right eye; OS left eye; OU both eyes  XT exotropia; ET esotropia; PEK punctate epithelial keratitis; PEE punctate epithelial erosions; DES dry eye syndrome; MGD meibomian gland dysfunction; ATs artificial tears; PFAT's preservative free artificial tears; NSC nuclear sclerotic cataract; PSC posterior subcapsular cataract; ERM epi-retinal membrane; PVD posterior vitreous detachment; RD retinal detachment; DM diabetes mellitus; DR diabetic retinopathy; NPDR non-proliferative diabetic retinopathy; PDR proliferative diabetic retinopathy; CSME clinically significant macular edema; DME diabetic macular edema; dbh dot blot hemorrhages; CWS cotton wool spot; POAG primary open angle glaucoma; C/D cup-to-disc ratio; HVF humphrey visual field; GVF goldmann visual field; OCT optical coherence tomography; IOP intraocular pressure; BRVO Branch retinal vein occlusion; CRVO central retinal vein occlusion; CRAO central retinal artery occlusion; BRAO branch retinal artery occlusion; RT retinal tear; SB scleral buckle; PPV pars  plana vitrectomy; VH Vitreous hemorrhage; PRP panretinal laser photocoagulation; IVK intravitreal kenalog; VMT vitreomacular traction; MH Macular hole;  NVD neovascularization of the disc; NVE neovascularization elsewhere; AREDS age related eye disease study; ARMD age related macular degeneration; POAG  primary open angle glaucoma; EBMD epithelial/anterior basement membrane dystrophy; ACIOL anterior chamber intraocular lens; IOL intraocular lens; PCIOL posterior chamber intraocular lens; Phaco/IOL phacoemulsification with intraocular lens placement; [place] photorefractive keratectomy; LASIK laser assisted in situ keratomileusis; HTN hypertension; DM diabetes mellitus; COPD chronic obstructive pulmonary disease

## 2022-06-30 NOTE — Assessment & Plan Note (Signed)
Patient continues to not smoke.  

## 2022-07-30 ENCOUNTER — Ambulatory Visit (INDEPENDENT_AMBULATORY_CARE_PROVIDER_SITE_OTHER): Payer: PPO | Admitting: Ophthalmology

## 2022-07-30 ENCOUNTER — Encounter (INDEPENDENT_AMBULATORY_CARE_PROVIDER_SITE_OTHER): Payer: Self-pay | Admitting: Ophthalmology

## 2022-07-30 DIAGNOSIS — H353211 Exudative age-related macular degeneration, right eye, with active choroidal neovascularization: Secondary | ICD-10-CM | POA: Diagnosis not present

## 2022-07-30 DIAGNOSIS — H353221 Exudative age-related macular degeneration, left eye, with active choroidal neovascularization: Secondary | ICD-10-CM

## 2022-07-30 NOTE — Progress Notes (Signed)
07/30/2022     CHIEF COMPLAINT Patient presents for  Chief Complaint  Patient presents with   Macular Degeneration      HISTORY OF PRESENT ILLNESS: Victor Walker is a 66 y.o. male who presents to the clinic today for:   HPI   4 weeks for DILATE, OS,OCT. Pt stated vision has remained stable. However, pt c/o "wavy lines" in the right eye.  Last edited by Angeline Slim on 07/30/2022  4:12 PM.      Referring physician: [name], MD [address] [place],  [place] [address]  HISTORICAL INFORMATION:   Selected notes from the MEDICAL RECORD NUMBER       CURRENT MEDICATIONS: Current Outpatient Medications (Ophthalmic Drugs)  Medication Sig   ofloxacin (OCUFLOX) 0.3 % ophthalmic solution  (Patient not taking: No sig reported)   prednisoLONE acetate (PRED FORTE) 1 % ophthalmic suspension SMARTSIG:In Eye(s) (Patient not taking: No sig reported)   No current facility-administered medications for this visit. (Ophthalmic Drugs)   Current Outpatient Medications (Other)  Medication Sig   atorvastatin (LIPITOR) 20 MG tablet Take 1 tablet by mouth daily.   clopidogrel (PLAVIX) 75 MG tablet Take 1 tablet (75 mg total) by mouth daily. (Patient not taking: Reported on 01/22/2021)   metoprolol tartrate (LOPRESSOR) 25 MG tablet Take 1 tablet (25 mg total) by mouth 2 (two) times daily.   nitroGLYCERIN (NITROSTAT) 0.4 MG SL tablet Place 1 tablet (0.4 mg total) under the tongue every 5 (five) minutes as needed for chest pain.   ondansetron (ZOFRAN-ODT) 4 MG disintegrating tablet Take 1 tablet (4 mg total) by mouth every 8 (eight) hours as needed for nausea or vomiting.   terbinafine (LAMISIL) 250 MG tablet Take 1 tablet (250 mg total) by mouth daily.   traMADol (ULTRAM) 50 MG tablet Take 1-2 tablets by mouth as needed.   No current facility-administered medications for this visit. (Other)      REVIEW OF SYSTEMS: ROS   Negative for: Constitutional,  Gastrointestinal, Neurological, Skin, Genitourinary, Musculoskeletal, HENT, Endocrine, Cardiovascular, Eyes, Respiratory, Psychiatric, Allergic/Imm, Heme/Lymph Last edited by Angeline Slim on 07/30/2022  4:12 PM.       ALLERGIES Allergies  Allergen Reactions   Aspirin Anaphylaxis   Penicillins Anaphylaxis    Has patient had a PCN reaction causing immediate rash, facial/tongue/throat swelling, SOB or lightheadedness with hypotension:YES Has patient had a PCN reaction causing severe rash involving mucus membranes or skin necrosis: NO Has patient had a PCN reaction that required hospitalization NO Has patient had a PCN reaction occurring within the last 10 years:NO If all of the above answers are "NO", then may proceed with Cephalosporin use.   Shrimp [Shellfish Allergy] Anaphylaxis    PAST MEDICAL HISTORY Past Medical History:  Diagnosis Date   Corneal abrasion, right, initial encounter 02/20/2021   Patient awakened this morning with a foreign body sensation in his eye.   H/O bilateral inguinal hernia repair    TB (pulmonary tuberculosis)    Past Surgical History:  Procedure Laterality Date   CATARACT EXTRACTION Right 01/29/2021   Dr. [name]   CATARACT EXTRACTION Left 02/05/2021   Dr. [name]   HERNIA REPAIR     KIDNEY STONE SURGERY      FAMILY HISTORY Family History  Problem Relation Age of Onset   Diabetes Mother    Hypertension Mother     SOCIAL HISTORY Social History   Tobacco Use   Smoking status: Every Day   Smokeless  tobacco: Never  Vaping Use   Vaping Use: Never used  Substance Use Topics   Alcohol use: Yes   Drug use: Yes    Types: Marijuana         OPHTHALMIC EXAM:  Base Eye Exam     Visual Acuity (ETDRS)       Right Left   Dist cc 20/30 -2 20/20 -2   Dist ph cc 20/25 -1     Correction: Glasses         Tonometry (Tonopen, 4:18 PM)       Right Left   Pressure 9 8         Pupils       Pupils APD   Right PERRL None   Left PERRL None          Visual Fields       Left Right    Full Full         Extraocular Movement       Right Left    Full, Ortho Full, Ortho         Neuro/Psych     Oriented x3: Yes   Mood/Affect: Normal         Dilation     Left eye: 2.5% Phenylephrine, 1.0% Mydriacyl @ 4:18 PM           Slit Lamp and Fundus Exam     External Exam       Right Left   External Normal Normal         Slit Lamp Exam       Right Left   Lids/Lashes Normal Normal   Conjunctiva/Sclera White and quiet White and quiet   Cornea Clear, no epi defects Clear   Anterior Chamber Deep and quiet,,  no cells,  Deep and quiet   Iris Round and reactive Round and reactive   Lens Centered posterior chamber intraocular lens Centered posterior chamber intraocular lens   Anterior Vitreous Normal Normal         Fundus Exam       Right Left   Posterior Vitreous  Posterior vitreous detachment   Disc  Normal   C/D Ratio  0.2   Macula  Pigmented nodule superonasal to the macula foveal region left eye, smaller ring of atrophy no subretinal fluid, smaller today   Vessels  Normal   Periphery  Normal            IMAGING AND PROCEDURES  Imaging and Procedures for 07/30/22  OCT, Retina - OU - Both Eyes       Right Eye Quality was good. Scan locations included subfoveal. Central Foveal Thickness: 252. Progression has improved. Findings include abnormal foveal contour, disciform scar, intraretinal fluid.   Left Eye Quality was good. Scan locations included subfoveal. Central Foveal Thickness: 284. Progression has improved. Findings include abnormal foveal contour, subretinal hyper-reflective material, disciform scar, intraretinal fluid.   Notes Improving macular condition in each eye post recent Avastin OD, and less subretinal fluid.  Today at 4-week week postinjection with less subretinal fluid, repeat injection OD TODAY and reevaluate in 2 to 3 months                 ASSESSMENT/PLAN:  Exudative age-related macular degeneration of right eye with active choroidal neovascularization (HCC) OD, doing well at current interval of examination and follow-up which is 5 weeks.  Most recently went out to 9 to 10 weeks.  We will continue to monitor and follow-up again  in 8 to 9 weeks  Exudative age-related macular degeneration of left eye with active choroidal neovascularization (HCC) OS doing well today at 5 weeks postinjection no sign of recurrence coincident also with patient discontinuing smoking  We will thus follow-up again in 8 to 9 weeks     ICD-10-CM   1. Exudative age-related macular degeneration of left eye with active choroidal neovascularization (HCC)  H35.3221 OCT, Retina - OU - Both Eyes    2. Exudative age-related macular degeneration of right eye with active choroidal neovascularization (HCC)  H35.3211       1.  OU with history of active CNVM.  Now much less activity of 4 to 5 weeks post most recent evaluation.  2.  Coincidentally patient did discontinue smoking thus enhance oxygenation present may be a contributing factor to lengthen in the interval follow-up safely.  3.  Ophthalmic Meds Ordered this visit:  No orders of the defined types were placed in this encounter.      Return in about 9 weeks (around 10/01/2022) for DILATE OU, COLOR FP, OCT.  There are no Patient Instructions on file for this visit.   Explained the diagnoses, plan, and follow up with the patient and they expressed understanding.  Patient expressed understanding of the importance of proper follow up care.   Alford Highland Paul Trettin M.D. Diseases & Surgery of the Retina and Vitreous [facility] 07/30/22     Abbreviations: M myopia (nearsighted); A astigmatism; H hyperopia (farsighted); P presbyopia; Mrx spectacle prescription;  CTL contact lenses; OD right eye; OS left eye; OU both eyes  XT exotropia; ET esotropia; PEK punctate epithelial keratitis;  PEE punctate epithelial erosions; DES dry eye syndrome; MGD meibomian gland dysfunction; ATs artificial tears; PFAT's preservative free artificial tears; NSC nuclear sclerotic cataract; PSC posterior subcapsular cataract; ERM epi-retinal membrane; PVD posterior vitreous detachment; RD retinal detachment; DM diabetes mellitus; DR diabetic retinopathy; NPDR non-proliferative diabetic retinopathy; PDR proliferative diabetic retinopathy; CSME clinically significant macular edema; DME diabetic macular edema; dbh dot blot hemorrhages; CWS cotton wool spot; POAG primary open angle glaucoma; C/D cup-to-disc ratio; HVF humphrey visual field; GVF goldmann visual field; OCT optical coherence tomography; IOP intraocular pressure; BRVO Branch retinal vein occlusion; CRVO central retinal vein occlusion; CRAO central retinal artery occlusion; BRAO branch retinal artery occlusion; RT retinal tear; SB scleral buckle; PPV pars plana vitrectomy; VH Vitreous hemorrhage; PRP panretinal laser photocoagulation; IVK intravitreal kenalog; VMT vitreomacular traction; MH Macular hole;  NVD neovascularization of the disc; NVE neovascularization elsewhere; AREDS age related eye disease study; ARMD age related macular degeneration; POAG primary open angle glaucoma; EBMD epithelial/anterior basement membrane dystrophy; ACIOL anterior chamber intraocular lens; IOL intraocular lens; PCIOL posterior chamber intraocular lens; Phaco/IOL phacoemulsification with intraocular lens placement; PRK photorefractive keratectomy; LASIK laser assisted in situ keratomileusis; HTN hypertension; DM diabetes mellitus; COPD chronic obstructive pulmonary disease

## 2022-07-30 NOTE — Assessment & Plan Note (Signed)
OD, doing well at current interval of examination and follow-up which is 5 weeks.  Most recently went out to 9 to 10 weeks.  We will continue to monitor and follow-up again in 8 to 9 weeks

## 2022-07-30 NOTE — Assessment & Plan Note (Signed)
OS doing well today at 5 weeks postinjection no sign of recurrence coincident also with patient discontinuing smoking  We will thus follow-up again in 8 to 9 weeks

## 2022-10-01 ENCOUNTER — Encounter (INDEPENDENT_AMBULATORY_CARE_PROVIDER_SITE_OTHER): Payer: PPO | Admitting: Ophthalmology

## 2022-10-01 ENCOUNTER — Encounter (INDEPENDENT_AMBULATORY_CARE_PROVIDER_SITE_OTHER): Payer: Self-pay

## 2022-10-01 IMAGING — CT CT ABD-PELV W/ CM
2 of 5 series · 15 of 46 positions shown, 17 images · IV contrast (agent unspecified)
Comparison: January 02, 2021 and November 05, 2016

CLINICAL DATA: Nausea, vomiting and diarrhea.

EXAM:
CT ABDOMEN AND PELVIS WITH CONTRAST
TECHNIQUE: Multidetector CT imaging of the abdomen and pelvis was performed
using the standard protocol following bolus administration of
intravenous contrast.

[Series 2: axial st · axial · 0.73mm/px · z∈[-508,-93]mm · 12 of 97 slices shown, 14 images]
[im 7/97  soft-tissue]
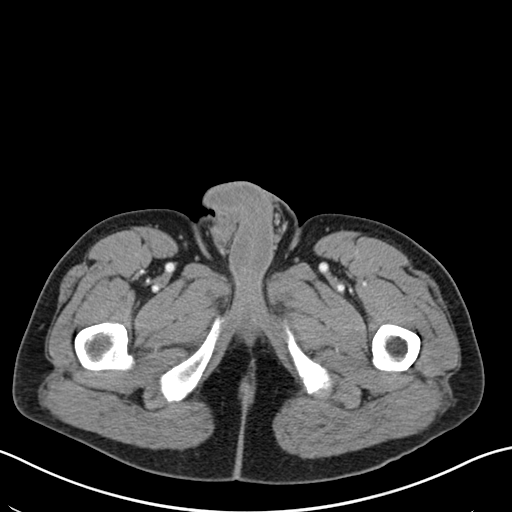
[im 7/97  bone]
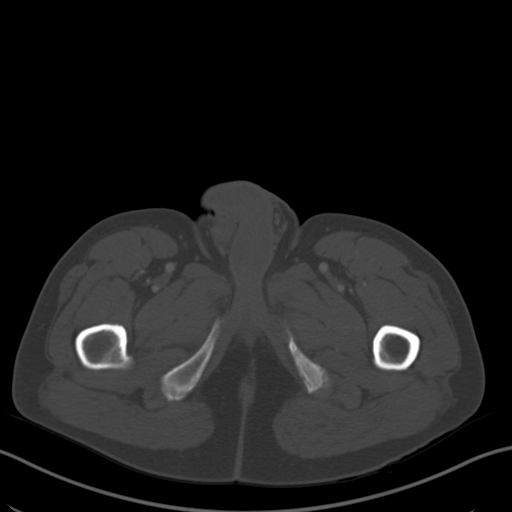
[im 13/97  soft-tissue]
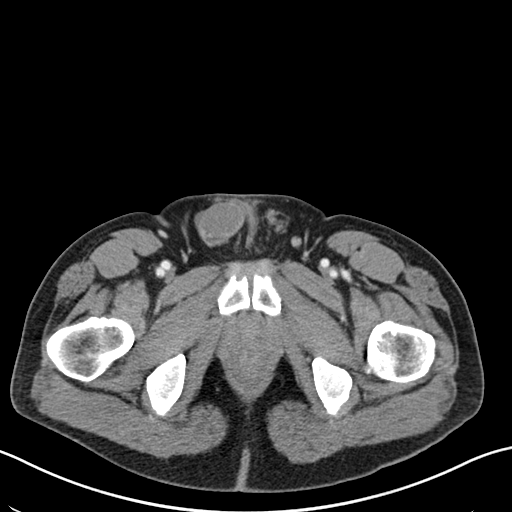
[im 20/97  soft-tissue]
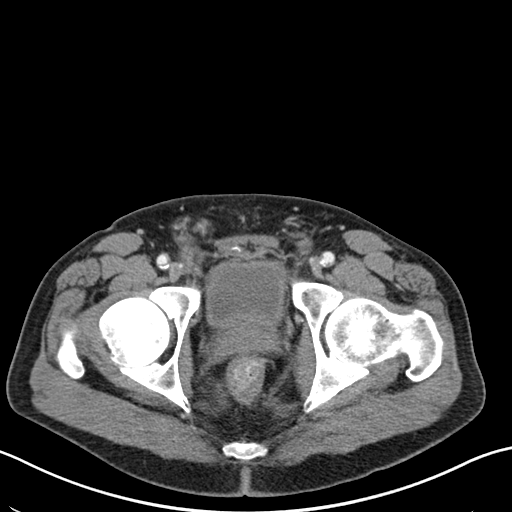
[im 33/97  soft-tissue]
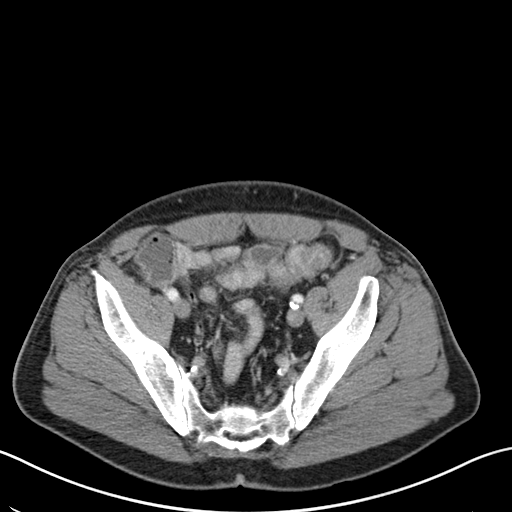
[im 39/97  soft-tissue]
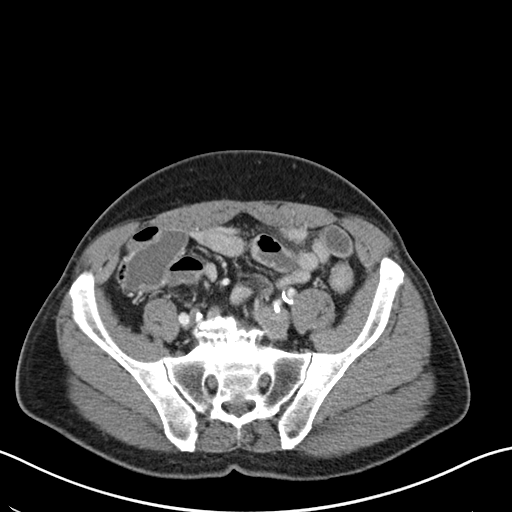
[im 45/97  soft-tissue]
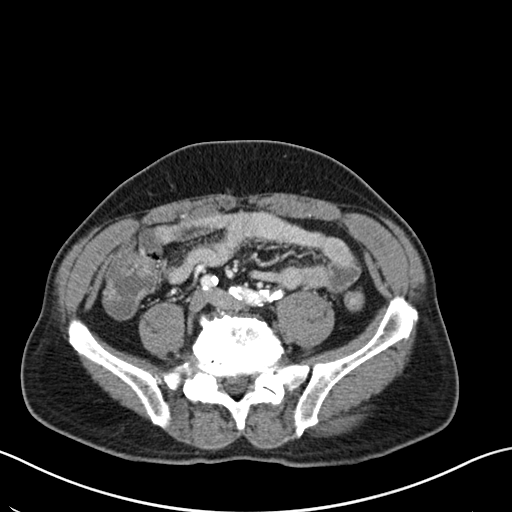
[im 52/97  soft-tissue]
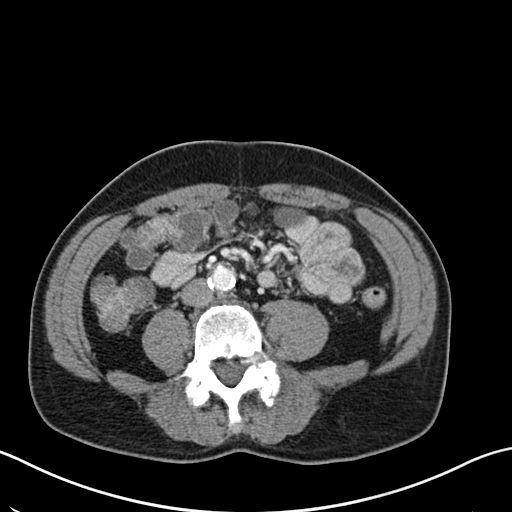
[im 58/97  soft-tissue]
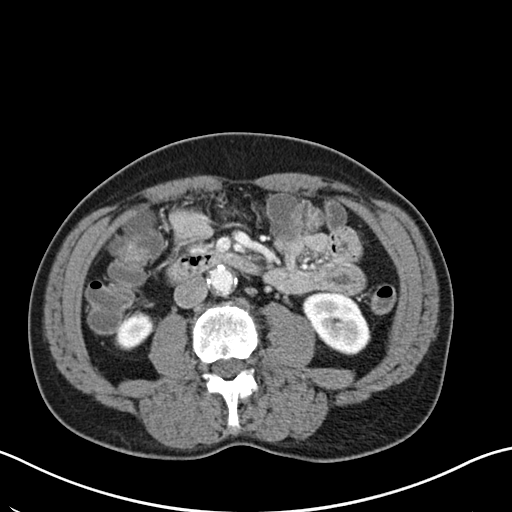
[im 65/97  soft-tissue]
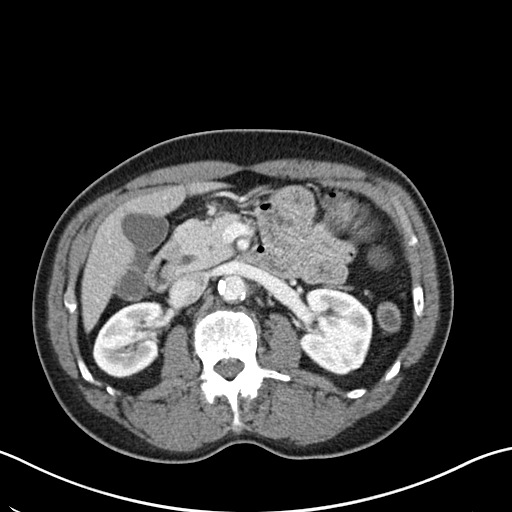
[im 65/97  bone]
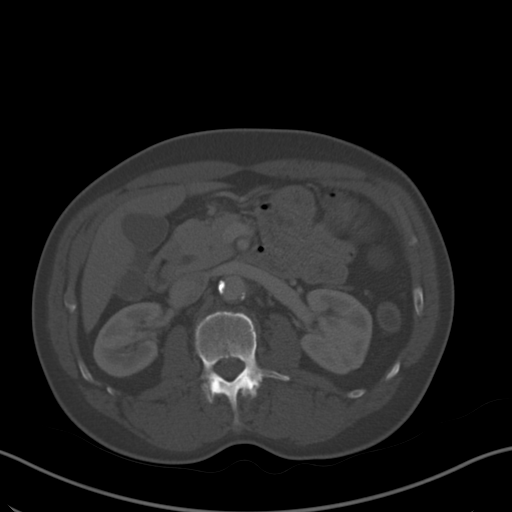
[im 77/97  soft-tissue]
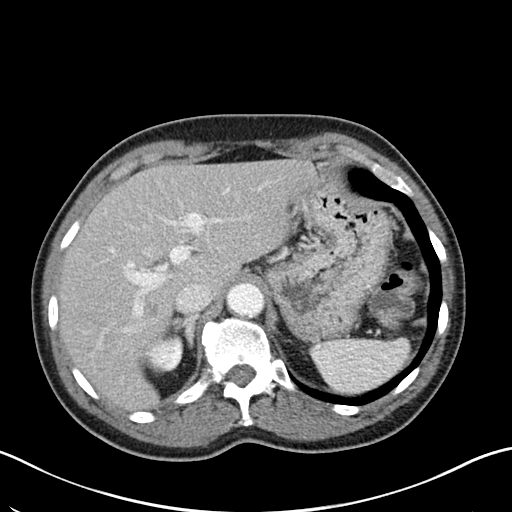
[im 84/97  soft-tissue]
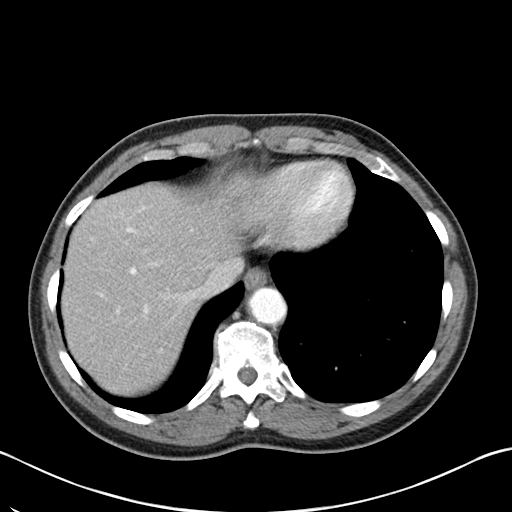
[im 90/97  soft-tissue]
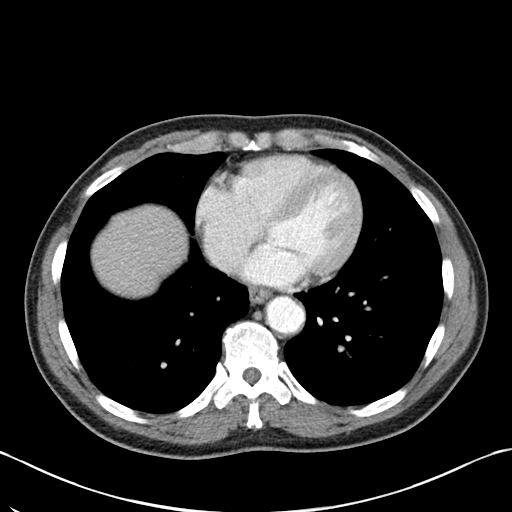

[Series 6: coronal st · coronal · 0.69mm/px · 3 of 151 slices shown]
[im 51/151  soft-tissue]
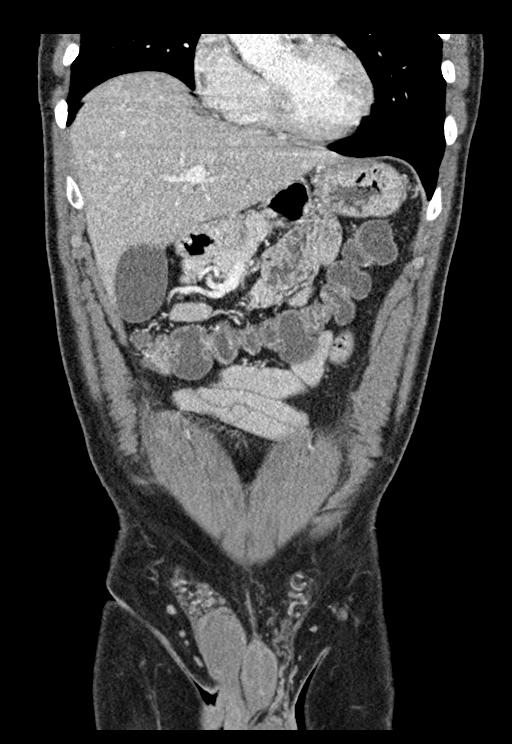
[im 67/151  soft-tissue]
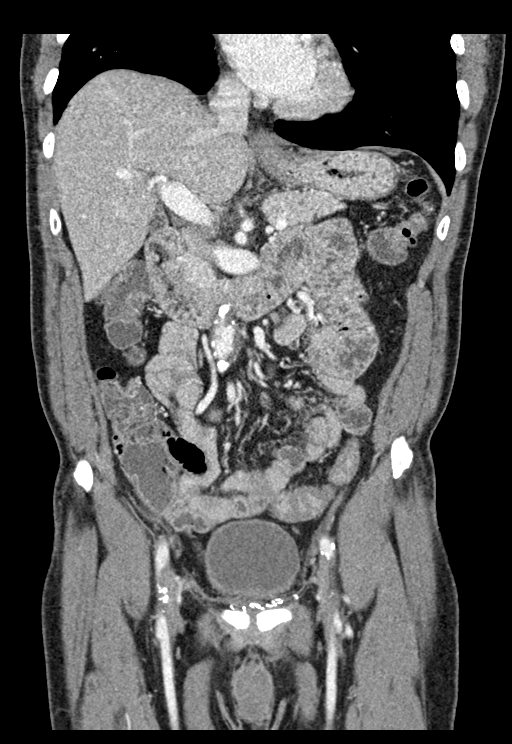
[im 84/151  soft-tissue]
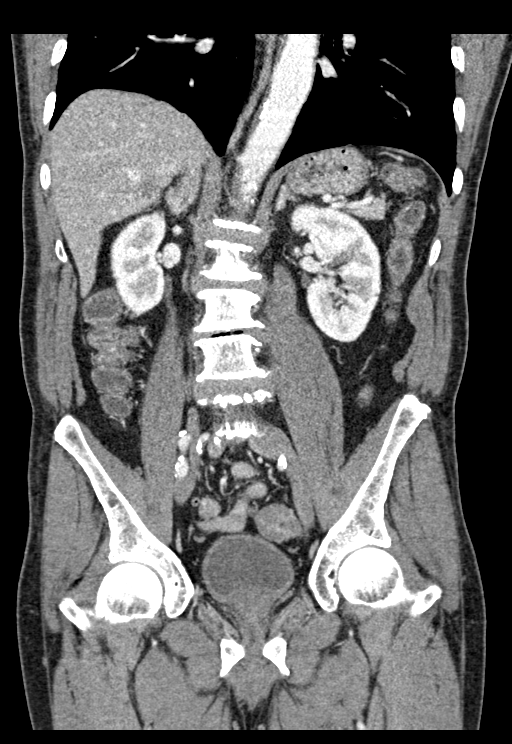

[15 of 46 positions shown; findings below may reference images not displayed]

RADIATION DOSE REDUCTION: This exam was performed according to the
departmental dose-optimization program which includes automated
exposure control, adjustment of the mA and/or kV according to
patient size and/or use of iterative reconstruction technique.

CONTRAST:  100mL OMNIPAQUE IOHEXOL 300 MG/ML  SOLN
FINDINGS: Lower chest: No acute abnormality.

Hepatobiliary: A stable 1.6 cm x 1.2 cm area of parenchymal low
attenuation is seen within the posteromedial aspect of the right
lobe of the liver (axial CT image 22, CT series 2). No gallstones,
gallbladder wall thickening, or biliary dilatation.

Pancreas: Unremarkable. No pancreatic ductal dilatation or
surrounding inflammatory changes.

Spleen: Normal in size without focal abnormality.

Adrenals/Urinary Tract: Adrenal glands are unremarkable. Kidneys are
normal, without renal calculi, focal lesion, or hydronephrosis.
Bladder is unremarkable.

Stomach/Bowel: Stomach is within normal limits. Appendix appears
normal. No evidence of bowel wall thickening, distention, or
inflammatory changes. Noninflamed diverticula are seen within the
sigmoid colon.

Vascular/Lymphatic: Aortic atherosclerosis. No enlarged abdominal or
pelvic lymph nodes.

Reproductive: Prostate is unremarkable.

Other: Surgical clips are seen along the anterior aspect of the
lower pelvis. A 3.4 cm x 2.7 cm area of fluid attenuation is seen
along the superior aspect of the scrotal canal.

No abdominopelvic ascites.

Musculoskeletal: Multilevel degenerative changes seen throughout the
lumbar spine.
IMPRESSION: 1. Sigmoid diverticulosis.
2. Fluid attenuation along the superior aspect of the scrotal canal
which may represent a hydrocele. Correlation with physical exam is
recommended.
3. Aortic atherosclerosis.

Aortic Atherosclerosis ([id number]).

## 2022-10-16 LAB — COLOGUARD: COLOGUARD: NEGATIVE

## 2023-01-15 ENCOUNTER — Emergency Department (HOSPITAL_COMMUNITY)
Admission: EM | Admit: 2023-01-15 | Discharge: 2023-01-15 | Disposition: A | Discharge status: Home / Self Care | Payer: PPO | Attending: Emergency Medicine | Admitting: Emergency Medicine

## 2023-01-15 ENCOUNTER — Emergency Department (HOSPITAL_COMMUNITY): Payer: PPO

## 2023-01-15 ENCOUNTER — Other Ambulatory Visit: Payer: Self-pay

## 2023-01-15 ENCOUNTER — Encounter (HOSPITAL_COMMUNITY): Payer: Self-pay

## 2023-01-15 DIAGNOSIS — Z79899 Other long term (current) drug therapy: Secondary | ICD-10-CM | POA: Diagnosis not present

## 2023-01-15 DIAGNOSIS — F17210 Nicotine dependence, cigarettes, uncomplicated: Secondary | ICD-10-CM | POA: Diagnosis not present

## 2023-01-15 DIAGNOSIS — R519 Headache, unspecified: Secondary | ICD-10-CM | POA: Insufficient documentation

## 2023-01-15 DIAGNOSIS — R0789 Other chest pain: Secondary | ICD-10-CM | POA: Diagnosis present

## 2023-01-15 DIAGNOSIS — Z7902 Long term (current) use of antithrombotics/antiplatelets: Secondary | ICD-10-CM | POA: Diagnosis not present

## 2023-01-15 LAB — BASIC METABOLIC PANEL
Anion gap: 7 (ref 5–15)
BUN: 10 mg/dL (ref 8–23)
CO2: 28 mmol/L (ref 22–32)
Calcium: 9.5 mg/dL (ref 8.9–10.3)
Chloride: 103 mmol/L (ref 98–111)
Creatinine, Ser: 1.05 mg/dL (ref 0.61–1.24)
GFR, Estimated: >60 mL/min (ref >60)
Glucose, Bld: 144 mg/dL — ABNORMAL HIGH (ref 70–99)
Potassium: 4.7 mmol/L (ref 3.5–5.1)
Sodium: 138 mmol/L (ref 135–145)

## 2023-01-15 LAB — CBC
HCT: 45.7 % (ref 39.0–52.0)
Hemoglobin: 14.5 g/dL (ref 13.0–17.0)
MCH: 28.4 pg (ref 26.0–34.0)
MCHC: 31.7 g/dL (ref 30.0–36.0)
MCV: 89.4 fL (ref 80.0–100.0)
Platelets: 302 10*3/uL (ref 150–400)
RBC: 5.11 MIL/uL (ref 4.22–5.81)
RDW: 14 % (ref 11.5–15.5)
WBC: 4.9 10*3/uL (ref 4.0–10.5)
nRBC: 0 % (ref 0.0–0.2)

## 2023-01-15 LAB — TROPONIN I (HIGH SENSITIVITY)
Troponin I (High Sensitivity): 3 ng/L (ref <18)
Troponin I (High Sensitivity): 3 ng/L (ref <18)

## 2023-01-15 NOTE — ED Provider Triage Note (Signed)
Emergency Medicine Provider Triage Evaluation Note  Victor Walker , a 66 y.o. male  was evaluated in triage.  Pt complains of severe chest pain that felt like pressure that began at 8 PM last night.  Patient denies exertional component.  He reports history of tobacco abuse but has been on patch instead of smoking for the last 3 months.  No previous history of diagnosed hypertension, does have a history of hyperlipidemia no longer takes his statin.  He had an echo and CT coronary 2 years ago which were unremarkable, normal ejection fraction.  No history of diabetes, he denies significant shortness of breath, nausea, vomiting, radiation to arms, neck.  Patient reports most severe last night, and then intermittent since then, he cannot tell what makes it better or worse..  Review of Systems  Positive: Chest pain Negative: Shob, nv  Physical Exam  BP (!) 165/117   Pulse 84   Temp 98.1 F (36.7 C) (Oral)   Resp 18   Ht '5\' 11"'$  (1.803 m)   Wt 77.1 kg   SpO2 100%   BMI 23.71 kg/m  Gen:   Awake, no distress   Resp:  Normal effort  MSK:   Moves extremities without difficulty  Other:    Medical Decision Making  Medically screening exam initiated at 12:42 PM.  Appropriate orders placed.  EVELYN KUYPER was informed that the remainder of the evaluation will be completed by another provider, this initial triage assessment does not replace that evaluation, and the importance of remaining in the ED until their evaluation is complete.  Workup initiated in triage    Anselmo Pickler, [place] 01/15/23 1244

## 2023-01-15 NOTE — ED Provider Notes (Signed)
[facility] Provider Note   CSN: [id number] Arrival date & time: 01/15/23  1126     History  Chief Complaint  Patient presents with   Chest Pain    Victor Walker is a 67 y.o. male.  With history of mixed lipidemia, history of smoking and stopped 3 months ago who presents to the ED for evaluation of chest pain.  He states the chest pain is left-sided and began yesterday at approximately 8 PM.  It was sudden onset.  Max intensity headache 10 out of 10.  It progressively got better overnight and he was able to sleep through the night.  States he woke up today and found that the chest pain was still there.  Currently rates it at a 2 out of 10.  He denies all associated symptoms including shortness of breath, cough, dizziness, lightheadedness, nausea, vomiting, diaphoresis, fevers, chills.  States similar symptoms happened a few years ago and he followed with cardiology but states that they did not find anything so he stopped going.  He has not taken anything for his symptoms.   Chest Pain      Home Medications Prior to Admission medications   Medication Sig Start Date End Date Taking? Authorizing Provider  atorvastatin (LIPITOR) 20 MG tablet Take 1 tablet by mouth daily. 12/10/20   [provider]  clopidogrel (PLAVIX) 75 MG tablet Take 1 tablet (75 mg total) by mouth daily. Patient not taking: Reported on 01/22/2021 01/02/21   Nigel Mormon, MD  metoprolol tartrate (LOPRESSOR) 25 MG tablet Take 1 tablet (25 mg total) by mouth 2 (two) times daily. 01/02/21 04/02/21  Patwardhan, Reynold Bowen, MD  nitroGLYCERIN (NITROSTAT) 0.4 MG SL tablet Place 1 tablet (0.4 mg total) under the tongue every 5 (five) minutes as needed for chest pain. 01/02/21 04/02/21  Nigel Mormon, MD  ofloxacin (OCUFLOX) 0.3 % ophthalmic solution  02/12/21   [provider]  ondansetron (ZOFRAN-ODT) 4 MG disintegrating tablet Take 1 tablet (4 mg total)  by mouth every 8 (eight) hours as needed for nausea or vomiting. 02/15/22   Montine Circle, PA-C  prednisoLONE acetate (PRED FORTE) 1 % ophthalmic suspension SMARTSIG:In Eye(s) Patient not taking: No sig reported 02/12/21   [provider]  terbinafine (LAMISIL) 250 MG tablet Take 1 tablet (250 mg total) by mouth daily. 06/02/21   White, Leitha Schuller, NP  traMADol (ULTRAM) 50 MG tablet Take 1-2 tablets by mouth as needed. 12/06/20   [provider]      Allergies    Aspirin, Penicillins, and Shrimp [shellfish allergy]    Review of Systems   Review of Systems  Cardiovascular:  Positive for chest pain.  All other systems reviewed and are negative.   Physical Exam Updated Vital Signs BP (!) 131/99   Pulse 75   Temp 98 F (36.7 C) (Oral)   Resp 18   Ht '5\' 11"'$  (1.803 m)   Wt 77.1 kg   SpO2 99%   BMI 23.71 kg/m  Physical Exam Vitals and nursing note reviewed.  Constitutional:      General: He is not in acute distress.    Appearance: He is well-developed.     Comments: Resting comfortably in bed  HENT:     Head: Normocephalic and atraumatic.  Eyes:     Conjunctiva/sclera: Conjunctivae normal.  Neck:     Vascular: No JVD.  Cardiovascular:     Rate and Rhythm: Normal rate and regular  rhythm.     Pulses:          Carotid pulses are 2+ on the right side and 2+ on the left side.    Heart sounds: No murmur heard. Pulmonary:     Effort: Pulmonary effort is normal. No respiratory distress.     Breath sounds: Normal breath sounds. No decreased breath sounds, wheezing, rhonchi or rales.  Abdominal:     Palpations: Abdomen is soft.     Tenderness: There is no abdominal tenderness.  Musculoskeletal:        General: No swelling.     Cervical back: Neck supple.     Right lower leg: No edema.     Left lower leg: No edema.  Skin:    General: Skin is warm and dry.     Capillary Refill: Capillary refill takes less than 2 seconds.  Neurological:     Mental Status: He  is alert.  Psychiatric:        Mood and Affect: Mood normal.     ED Results / Procedures / Treatments   Labs (all labs ordered are listed, but only abnormal results are displayed) Labs Reviewed  BASIC METABOLIC PANEL - Abnormal; Notable for the following components:      Result Value   Glucose, Bld 144 (*)    All other components within normal limits  CBC  TROPONIN I (HIGH SENSITIVITY)  TROPONIN I (HIGH SENSITIVITY)    EKG EKG Interpretation  Date/Time:  Friday January 15 2023 11:12:26 EST Ventricular Rate:  93 PR Interval:  170 QRS Duration: 94 QT Interval:  348 QTC Calculation: 432 R Axis:   38 Text Interpretation: Normal sinus rhythm with sinus arrhythmia Incomplete right bundle branch block Borderline ECG When compared with ECG of 04-Apr-2018 11:49, PREVIOUS ECG IS PRESENT No acute changes No significant change since last tracing Confirmed by Varney Biles [phone]) on 01/15/2023 12:58:12 PM  Radiology DG Chest 2 View  Result Date: 01/15/2023 CLINICAL DATA:  Chest pain EXAM: CHEST - 2 VIEW COMPARISON:  Chest CT 08/29/21, CXR 12/08/19 FINDINGS: No pleural effusion. No pneumothorax. No focal airspace opacity. Normal cardiac and mediastinal contours. No radiographically apparent displaced rib fractures. Visualized upper is unremarkable. Vertebral body heights are maintained. IMPRESSION: No radiographic finding to explain chest pain. Electronically Signed   By: Marin Roberts M.D.   On: 01/15/2023 12:12    Procedures Procedures    Medications Ordered in ED Medications - No data to display  ED Course/ Medical Decision Making/ A&P             HEART Score: 4                Medical Decision Making Amount and/or Complexity of Data Reviewed Labs: ordered. Radiology: ordered.  This patient presents to the ED for concern of chest pain, this involves an extensive number of treatment options, and is a complaint that carries with it a high risk of complications and morbidity.   The emergent differential diagnosis of chest pain includes: Acute coronary syndrome, pericarditis, aortic dissection, pulmonary embolism, tension pneumothorax, and esophageal rupture.  other urgent/non-acute considerations include, but are not limited to: chronic angina, aortic stenosis, cardiomyopathy, myocarditis, mitral valve prolapse, pulmonary hypertension, hypertrophic obstructive cardiomyopathy (HOCM), aortic insufficiency, right ventricular hypertrophy, pneumonia, pleuritis, bronchitis, pneumothorax, tumor, gastroesophageal reflux disease (GERD), esophageal spasm, Mallory-Weiss syndrome, peptic ulcer disease, biliary disease, pancreatitis, functional gastrointestinal pain, cervical or thoracic disk disease or arthritis, shoulder arthritis, costochondritis, subacromial bursitis, anxiety or panic  attack, herpes zoster, breast disorders, chest wall tumors, thoracic outlet syndrome, mediastinitis.  Co morbidities that complicate the patient evaluation   mixed hyperlipidemia, cigarette smoking  My initial workup includes ACS rule out  Additional history obtained from: Nursing notes from this visit. Previous records within EMR system cardiology visit on 01/02/2021 for chest pain.  CT coronary angiogram ordered and found minimal coronary artery occlusions  I ordered, reviewed and interpreted labs which include: CBC, BMP, troponin, delta troponin.  Hyperglycemia 144.  Labs otherwise unremarkable  I ordered imaging studies including chest x-ray I independently visualized and interpreted imaging which showed unremarkable I agree with the radiologist interpretation  Afebrile, slightly hypertensive but otherwise hemodynamically stable.  67 year old male presenting to ED for evaluation of left-sided chest pain that began last night.  It has progressively improved since that time.  He states it is completely resolved while he was in the ED.  His EKG shows no ischemic changes.  Troponin and delta  troponin negative.  Chest x-ray unremarkable.  Does have a heart score of 4, however does not want to be admitted for his symptoms.  He states he will be able to follow-up with his primary cardiologist.  I believe this is reasonable for discharge at this time and given strict return precautions.  Stable at the time of discharge.  At this time there does not appear to be any evidence of an acute emergency medical condition and the patient appears stable for discharge with appropriate outpatient follow up. Diagnosis was discussed with patient who verbalizes understanding of care plan and is agreeable to discharge. I have discussed return precautions with patient who verbalizes understanding. Patient encouraged to follow-up with their primary cardiologist within 1 week. All questions answered.  Patient's case discussed with Dr. Kathrynn Humble who agrees with plan to discharge with follow-up.   Note: Portions of this report may have been transcribed using voice recognition software. Every effort was made to ensure accuracy; however, inadvertent computerized transcription errors may still be present.        Final Clinical Impression(s) / ED Diagnoses Final diagnoses:  Atypical chest pain    Rx / DC Orders ED Discharge Orders     None         Roylene Reason, Hershal Coria 01/15/23 [place], MD 01/16/23 1131

## 2023-01-15 NOTE — Discharge Instructions (Addendum)
You have been seen today for your complaint of chest pain. Your lab work was reassuring and showed no abnormalities. Your imaging was reassuring and showed no abnormalities. Follow up with: Your cardiologist as soon as possible for reevaluation of your symptoms Please seek immediate medical care if you develop any of the following symptoms: Your chest pain gets worse. You have a cough that gets worse, or you cough up blood. You have severe pain in your abdomen. You faint. You have sudden, unexplained chest discomfort. You have sudden, unexplained discomfort in your arms, back, neck, or jaw. You have shortness of breath at any time. You suddenly start to sweat, or your skin gets clammy. You feel nausea or you vomit. You suddenly feel lightheaded or dizzy. You have severe weakness, or unexplained weakness or fatigue. Your heart begins to beat quickly, or it feels like it is skipping beats. At this time there does not appear to be the presence of an emergent medical condition, however there is always the potential for conditions to change. Please read and follow the below instructions.  Do not take your medicine if  develop an itchy rash, swelling in your mouth or lips, or difficulty breathing; call 911 and seek immediate emergency medical attention if this occurs.  You may review your lab tests and imaging results in their entirety on your MyChart account.  Please discuss all results of fully with your primary care provider and other specialist at your follow-up visit.  Note: Portions of this text may have been transcribed using voice recognition software. Every effort was made to ensure accuracy; however, inadvertent computerized transcription errors may still be present.

## 2023-01-15 NOTE — ED Triage Notes (Signed)
Pt to ED c/o chest pain that started last night at 8pm, denies SHOB. Reports pain is constant in nature, and stabbing.

## 2024-04-14 ENCOUNTER — Encounter (HOSPITAL_COMMUNITY): Payer: Self-pay | Admitting: *Deleted

## 2024-04-14 ENCOUNTER — Ambulatory Visit (HOSPITAL_COMMUNITY): Admission: EM | Admit: 2024-04-14 | Discharge: 2024-04-14 | Disposition: A | Discharge status: Home / Self Care

## 2024-04-14 ENCOUNTER — Other Ambulatory Visit: Payer: Self-pay

## 2024-04-14 DIAGNOSIS — J029 Acute pharyngitis, unspecified: Secondary | ICD-10-CM | POA: Diagnosis not present

## 2024-04-14 LAB — POCT RAPID STREP A (OFFICE): Rapid Strep A Screen: NEGATIVE

## 2024-04-14 MED ORDER — PREDNISONE 20 MG PO TABS: 40.0000 mg | ORAL_TABLET | Freq: Every day | ORAL | 0 refills | Status: AC

## 2024-04-14 MED ORDER — AZELASTINE HCL 0.1 % NA SOLN
1.0000 | Freq: Two times a day (BID) | NASAL | 1 refills | Status: AC
Start: 2024-04-14 — End: ?

## 2024-04-14 NOTE — ED Provider Notes (Signed)
 [facility]  Note:  This document was prepared using Dragon voice recognition software and may include unintentional dictation errors.  MRN: [id number] DOB: 08/05/1956  Subjective:   Victor Walker is a 68 y.o. male presenting for severe sore throat that started this morning.  Patient denies any nasal congestion, cough, body aches, fatigue, fever.  Patient states that he woke up this morning with no throat pain but around midmorning developed severe throat pain with no known cause.  Patient denies any known sick contacts.  Patient states he took Tylenol  while waiting to be seen in urgent care with no relief.  Patient reports he is currently attempting to quit smoking but was concerned because he is smoked for many years for possible throat cancer as the cause of symptoms today.  No current facility-administered medications for this encounter.  Current Outpatient Medications:    atorvastatin (LIPITOR) 20 MG tablet, Take 1 tablet by mouth daily., Disp: , Rfl:    azelastine (ASTELIN) 0.1 % nasal spray, Place 1 spray into both nostrils 2 (two) times daily. Use in each nostril as directed, Disp: 30 mL, Rfl: 1   metoprolol  tartrate (LOPRESSOR ) 25 MG tablet, Take 1 tablet (25 mg total) by mouth 2 (two) times daily., Disp: 60 tablet, Rfl: 2   predniSONE  (DELTASONE ) 20 MG tablet, Take 2 tablets (40 mg total) by mouth daily for 5 days., Disp: 10 tablet, Rfl: 0   clopidogrel  (PLAVIX ) 75 MG tablet, Take 1 tablet (75 mg total) by mouth daily. (Patient not taking: Reported on 01/22/2021), Disp: 90 tablet, Rfl: 3   nitroGLYCERIN  (NITROSTAT ) 0.4 MG SL tablet, Place 1 tablet (0.4 mg total) under the tongue every 5 (five) minutes as needed for chest pain., Disp: 30 tablet, Rfl: 3   ofloxacin (OCUFLOX) 0.3 % ophthalmic solution, , Disp: , Rfl:    ondansetron  (ZOFRAN -ODT) 4 MG disintegrating tablet, Take 1 tablet (4 mg total) by mouth every 8 (eight) hours as needed for nausea or vomiting., Disp:  10 tablet, Rfl: 0   prednisoLONE acetate (PRED FORTE) 1 % ophthalmic suspension, SMARTSIG:In Eye(s) (Patient not taking: Reported on 05/07/2021), Disp: , Rfl:    terbinafine  (LAMISIL ) 250 MG tablet, Take 1 tablet (250 mg total) by mouth daily., Disp: 90 tablet, Rfl: 0   traMADol (ULTRAM) 50 MG tablet, Take 1-2 tablets by mouth as needed., Disp: , Rfl:    Allergies  Allergen Reactions   Aspirin Anaphylaxis   Penicillins Anaphylaxis    Has patient had a PCN reaction causing immediate rash, facial/tongue/throat swelling, SOB or lightheadedness with hypotension:YES Has patient had a PCN reaction causing severe rash involving mucus membranes or skin necrosis: NO Has patient had a PCN reaction that required hospitalization NO Has patient had a PCN reaction occurring within the last 10 years:NO If all of the above answers are "NO", then may proceed with Cephalosporin use.   Shrimp [Shellfish Allergy] Anaphylaxis    Past Medical History:  Diagnosis Date   Corneal abrasion, right, initial encounter 02/20/2021   Patient awakened this morning with a foreign body sensation in his eye.   H/O bilateral inguinal hernia repair    TB (pulmonary tuberculosis)      Past Surgical History:  Procedure Laterality Date   CATARACT EXTRACTION Right 01/29/2021   Dr. Gennie Kicks   CATARACT EXTRACTION Left 02/05/2021   Dr. Gennie Kicks   HERNIA REPAIR     KIDNEY STONE SURGERY      Family History  Problem Relation Age of Onset  Diabetes Mother    Hypertension Mother     Social History   Tobacco Use   Smoking status: Every Day   Smokeless tobacco: Never  Vaping Use   Vaping status: Never Used  Substance Use Topics   Alcohol use: Yes   Drug use: Yes    Types: Marijuana    ROS Refer to HPI for ROS details.  Objective:   Vitals: BP 134/86   Pulse 69   Temp 97.9 F (36.6 C)   Resp 20   SpO2 98%   Physical Exam Vitals and nursing note reviewed.  Constitutional:      General: He is not in  acute distress.    Appearance: Normal appearance. He is well-developed. He is not ill-appearing or toxic-appearing.  HENT:     Head: Normocephalic.     Right Ear: Tympanic membrane, ear canal and external ear normal.     Left Ear: Tympanic membrane, ear canal and external ear normal.     Nose: Nose normal. No congestion or rhinorrhea.     Mouth/Throat:     Mouth: Mucous membranes are moist.     Pharynx: Oropharynx is clear. Posterior oropharyngeal erythema present. No oropharyngeal exudate.  Eyes:     General:        Right eye: No discharge.        Left eye: No discharge.     Extraocular Movements: Extraocular movements intact.     Conjunctiva/sclera: Conjunctivae normal.  Cardiovascular:     Rate and Rhythm: Normal rate.  Pulmonary:     Effort: Pulmonary effort is normal. No respiratory distress.  Musculoskeletal:     Cervical back: Normal range of motion and neck supple. Tenderness present. No rigidity.  Lymphadenopathy:     Cervical: No cervical adenopathy.  Skin:    General: Skin is warm and dry.  Neurological:     General: No focal deficit present.     Mental Status: He is alert and oriented to person, place, and time.  Psychiatric:        Mood and Affect: Mood normal.        Behavior: Behavior normal.     Procedures  Results for orders placed or performed during the hospital encounter of 04/14/24 (from the past 24 hours)  POC rapid strep A     Status: None   Collection Time: 04/14/24  3:02 PM  Result Value Ref Range   Rapid Strep A Screen Negative Negative    No results found.   Assessment and Plan :     Discharge Instructions       1. Acute pharyngitis, unspecified etiology (Primary) - POC rapid strep A completed in UC is negative for strep pharyngitis - Ambulatory referral to ENT provided during UC visit for follow-up evaluation and screening for throat cancer per patient request. - predniSONE  (DELTASONE ) 20 MG tablet; Take 2 tablets (40 mg total) by  mouth daily for 5 days.  Dispense: 10 tablet; Refill: 0 - azelastine (ASTELIN) 0.1 % nasal spray; Place 1 spray into both nostrils 2 (two) times daily. Use in each nostril as directed  Dispense: 30 mL; Refill: 1 -Continue to monitor symptoms for any change in severity if there is any escalation of current symptoms or development of new symptoms follow-up in ER for further evaluation and management.    Verner Mccrone B Verlinda Slotnick   Slyvester Latona, [place], [place] 04/14/24 1512

## 2024-04-14 NOTE — ED Triage Notes (Signed)
 PT reports the sore throat started this AM . Pt alos reports sever sore throat. Pt took tylenol .

## 2024-04-14 NOTE — Discharge Instructions (Addendum)
  1. Acute pharyngitis, unspecified etiology (Primary) - POC rapid strep A completed in UC is negative for strep pharyngitis - Ambulatory referral to ENT provided during UC visit for follow-up evaluation and screening for throat cancer per patient request. - predniSONE  (DELTASONE ) 20 MG tablet; Take 2 tablets (40 mg total) by mouth daily for 5 days.  Dispense: 10 tablet; Refill: 0 - azelastine (ASTELIN) 0.1 % nasal spray; Place 1 spray into both nostrils 2 (two) times daily. Use in each nostril as directed  Dispense: 30 mL; Refill: 1 -Continue to monitor symptoms for any change in severity if there is any escalation of current symptoms or development of new symptoms follow-up in ER for further evaluation and management.
